# Patient Record
Sex: Male | Born: 1945
Health system: Southern US, Community
[De-identification: ages and names within clinical notes are randomized; demographics above are authoritative.]

## PROBLEM LIST (undated history)

## (undated) DIAGNOSIS — B059 Measles without complication: Secondary | ICD-10-CM

## (undated) DIAGNOSIS — Z9289 Personal history of other medical treatment: Secondary | ICD-10-CM

## (undated) DIAGNOSIS — I10 Essential (primary) hypertension: Secondary | ICD-10-CM

## (undated) DIAGNOSIS — K5792 Diverticulitis of intestine, part unspecified, without perforation or abscess without bleeding: Secondary | ICD-10-CM

## (undated) DIAGNOSIS — B269 Mumps without complication: Secondary | ICD-10-CM

## (undated) DIAGNOSIS — B019 Varicella without complication: Secondary | ICD-10-CM

## (undated) DIAGNOSIS — K635 Polyp of colon: Secondary | ICD-10-CM

## (undated) DIAGNOSIS — M069 Rheumatoid arthritis, unspecified: Secondary | ICD-10-CM

## (undated) DIAGNOSIS — M1990 Unspecified osteoarthritis, unspecified site: Secondary | ICD-10-CM

## (undated) DIAGNOSIS — B1920 Unspecified viral hepatitis C without hepatic coma: Secondary | ICD-10-CM

## (undated) HISTORY — DX: Diverticulitis of intestine, part unspecified, without perforation or abscess without bleeding: K57.92

## (undated) HISTORY — PX: TONSILLECTOMY AND ADENOIDECTOMY: SUR1326

## (undated) HISTORY — DX: Polyp of colon: K63.5

## (undated) HISTORY — DX: Unspecified viral hepatitis C without hepatic coma: B19.20

## (undated) HISTORY — PX: APPENDECTOMY: SHX54

## (undated) HISTORY — DX: Personal history of other medical treatment: Z92.89

## (undated) HISTORY — DX: Rheumatoid arthritis, unspecified: M06.9

## (undated) HISTORY — PX: CHOLECYSTECTOMY: SHX55

## (undated) HISTORY — DX: Mumps without complication: B26.9

## (undated) HISTORY — DX: Essential (primary) hypertension: I10

## (undated) HISTORY — DX: Unspecified osteoarthritis, unspecified site: M19.90

## (undated) HISTORY — PX: REPLACEMENT TOTAL KNEE: SUR1224

## (undated) HISTORY — DX: Varicella without complication: B01.9

## (undated) HISTORY — DX: Measles without complication: B05.9

---

## 2015-12-20 LAB — HM COLONOSCOPY

## 2017-02-10 LAB — CBC AND DIFFERENTIAL
HEMATOCRIT: 45 (ref 41–53)
HEMOGLOBIN: 15.4 (ref 13.5–17.5)
NEUTROS ABS: 3
PLATELETS: 178 (ref 150–399)
WBC: 5.5

## 2017-02-10 LAB — BASIC METABOLIC PANEL
BUN: 11 (ref 4–21)
Creatinine: 0.7 (ref 0.6–1.3)
Glucose: 124
Potassium: 3.7 (ref 3.4–5.3)
Sodium: 138 (ref 137–147)

## 2017-02-10 LAB — HEPATIC FUNCTION PANEL
ALT: 12 (ref 10–40)
AST: 21 (ref 14–40)
Alkaline Phosphatase: 77 (ref 25–125)
BILIRUBIN, TOTAL: 0.3

## 2017-02-13 LAB — HM DEXA SCAN

## 2017-06-23 ENCOUNTER — Ambulatory Visit: Payer: Self-pay | Admitting: Family Medicine

## 2017-06-26 ENCOUNTER — Telehealth: Payer: Self-pay | Admitting: Family Medicine

## 2017-06-26 NOTE — Telephone Encounter (Signed)
We have not seen this pt nor have we prescribed any medications. Not sure what they are referring to. ATC [employer] at number given but it ends up disconnecting. ATC x2. Closing message.

## 2017-06-26 NOTE — Telephone Encounter (Signed)
Copied from [place]. Topic: Quick Communication - Rx Refill/Question >> Jun 26, 2017  1:17 PM Tye [place] wrote: Tommi Rumps from [facility] called b/c she wanted to check into the 4 medications that were faxed over on 12.6.18, they may not be informed that pt has not been seen yet, contact [employer] back thru the fax that was given

## 2017-06-27 ENCOUNTER — Encounter: Payer: Self-pay | Admitting: Adult Health

## 2017-06-27 ENCOUNTER — Ambulatory Visit (INDEPENDENT_AMBULATORY_CARE_PROVIDER_SITE_OTHER): Payer: Medicare HMO | Admitting: Adult Health

## 2017-06-27 VITALS — BP 158/82 | Temp 97.4°F | Ht 67.0 in | Wt 173.0 lb

## 2017-06-27 DIAGNOSIS — R600 Localized edema: Secondary | ICD-10-CM | POA: Insufficient documentation

## 2017-06-27 DIAGNOSIS — I10 Essential (primary) hypertension: Secondary | ICD-10-CM

## 2017-06-27 DIAGNOSIS — F1190 Opioid use, unspecified, uncomplicated: Secondary | ICD-10-CM

## 2017-06-27 DIAGNOSIS — G4700 Insomnia, unspecified: Secondary | ICD-10-CM | POA: Insufficient documentation

## 2017-06-27 DIAGNOSIS — Z7689 Persons encountering health services in other specified circumstances: Secondary | ICD-10-CM | POA: Diagnosis not present

## 2017-06-27 MED ORDER — LOSARTAN POTASSIUM-HCTZ 50-12.5 MG PO TABS: 1.0000 | ORAL_TABLET | Freq: Every day | ORAL | 1 refills | Status: DC

## 2017-06-27 NOTE — Progress Notes (Signed)
Patient presents to clinic today to establish care. He is a pleasant 71 year old male who  has a past medical history of Arthritis, Chicken pox, Colon polyps, Diverticulitis, Hepatitis C, History of blood transfusion, Hypertension, Measles, and Mumps.   He recently moved from [place]   His last physical was in November 2018    Acute Concerns: Establish Care   Chronic Issues: Hep C - Finished treatment 4 years ago.   Hypertension - Takes Hyzaar 50-12.5 mg. He did not take this today as he cannot find his prescription after moving.   Arthritis - Morphine 60 mg BID and Ibuprofen   Insomnia  - Takes Remeron 15 mg   Tobacco Use - Smokes half a pack a day. He has tried Chantix in the past but had to stop taking it due to night terrors   Health Maintenance: Dental -- Has dentures  Vision -- Does routine care  Immunizations -- UTD  Colonoscopy -- 2016 - five year plan  Diet: Eats healthy  Exercise: he goes to the gym daily      Past Medical History:  Diagnosis Date  . Arthritis    Lower spine, left knee and hands  . Chicken pox   . Colon polyps   . Diverticulitis   . Hepatitis C   . History of blood transfusion   . Hypertension   . Measles    Between 51-63 years of age  . Mumps    71 years of age    Past Surgical History:  Procedure Laterality Date  . APPENDECTOMY    . CHOLECYSTECTOMY    . REPLACEMENT TOTAL KNEE     Rt leg  . TONSILLECTOMY AND ADENOIDECTOMY      Current Outpatient Medications on File Prior to Visit  Medication Sig Dispense Refill  . Ascorbic Acid (VITAMIN C PO) Take by mouth.    . Cyanocobalamin (B-12 PO) Take by mouth.    . gabapentin (NEURONTIN) 400 MG capsule Take 400 mg by mouth 4 (four) times daily.    Marland Kitchen ibuprofen (ADVIL,MOTRIN) 600 MG tablet Take 1 tablet by mouth as needed.    Marland Kitchen losartan (COZAAR) 100 MG tablet Take 100 mg by mouth daily.    . mirtazapine (REMERON) 15 MG tablet Take 15 mg by mouth at bedtime.    Marland Kitchen morphine  (MS CONTIN) 60 MG 12 hr tablet Take 60 mg by mouth every 12 (twelve) hours.    . Multiple Vitamin (MULTIVITAMIN) tablet Take 1 tablet by mouth daily.     No current facility-administered medications on file prior to visit.     Allergies  Allergen Reactions  . Codeine     UPSET STOMACH    Family History  Problem Relation Age of Onset  . Emphysema Mother     Social History   Socioeconomic History  . Marital status: Single    Spouse name: Not on file  . Number of children: Not on file  . Years of education: Not on file  . Highest education level: Not on file  Social Needs  . Financial resource strain: Not on file  . Food insecurity - worry: Not on file  . Food insecurity - inability: Not on file  . Transportation needs - medical: Not on file  . Transportation needs - non-medical: Not on file  Occupational History  . Not on file  Tobacco Use  . Smoking status: Current Every Day Smoker  . Smokeless tobacco: Never Used  Substance and Sexual Activity  . Alcohol use: Yes    Alcohol/week: 3.0 oz    Types: 4 Cans of beer, 1 Shots of liquor per week  . Drug use: No  . Sexual activity: Not on file  Other Topics Concern  . Not on file  Social History Narrative  . Not on file    Review of Systems  Constitutional: Negative.   Respiratory: Negative.   Cardiovascular: Negative.   Genitourinary: Negative.   Musculoskeletal: Positive for back pain and joint pain.  Neurological: Negative.   Psychiatric/Behavioral: Negative for depression and suicidal ideas. The patient has insomnia.   All other systems reviewed and are negative.   BP (!) 158/82 (BP Location: Left Arm)   Temp (!) 97.4 F (36.3 C) (Oral)   Ht 5\' 7"  (1.702 m)   Wt 173 lb (78.5 kg)   BMI 27.10 kg/m   Physical Exam  Constitutional: He is oriented to person, place, and time and well-developed, well-nourished, and in no distress. No distress.  Cardiovascular: Normal rate, regular rhythm, normal heart sounds  and intact distal pulses. Exam reveals no gallop and no friction rub.  No murmur heard. Pulmonary/Chest: Effort normal and breath sounds normal. No respiratory distress. He has no wheezes. He has no rales. He exhibits no tenderness.  Musculoskeletal:  Walks with a single prong cane  Neurological: He is alert and oriented to person, place, and time. Gait normal. GCS score is 15.  Skin: Skin is warm and dry. No rash noted. He is not diaphoretic. No erythema. No pallor.  Psychiatric: Mood, memory, affect and judgment normal.  Nursing note and vitals reviewed.  Assessment/Plan: 1. Encounter to establish care - Follow up in November for CPE  - He brought his old records with him. Will review these records and add to chart  - Encouraged to continue exercise and eat healthy  - Needs to quit smoking   2. Narcotic drug use - Pain Mgmt, Profile 8 w/Conf, U - Ambulatory referral to Pain Clinic  3. Essential hypertension -  - losartan-hydrochlorothiazide (HYZAAR) 50-12.5 MG tablet; Take 1 tablet by mouth daily.  Dispense: 90 tablet; Refill: 1  4. Insomnia, unspecified type - Continue Remeron   [name], NP

## 2017-06-27 NOTE — Patient Instructions (Signed)
It was great meeting you today   I will follow up with you regarding your labs and will send in appropriate medications.   Someone from pain management will call you to schedule your appointment   Follow up with me in November for your physical exam    Health Maintenance, Male A healthy lifestyle and preventative care can promote health and wellness.  Maintain regular health, dental, and eye exams.  Eat a healthy diet. Foods like vegetables, fruits, whole grains, low-fat dairy products, and lean protein foods contain the nutrients you need and are low in calories. Decrease your intake of foods high in solid fats, added sugars, and salt. Get information about a proper diet from your health care provider, if necessary.  Regular physical exercise is one of the most important things you can do for your health. Most adults should get at least 150 minutes of moderate-intensity exercise (any activity that increases your heart rate and causes you to sweat) each week. In addition, most adults need muscle-strengthening exercises on 2 or more days a week.   Maintain a healthy weight. The body mass index (BMI) is a screening tool to identify possible weight problems. It provides an estimate of body fat based on height and weight. Your health care provider can find your BMI and can help you achieve or maintain a healthy weight. For males 20 years and older:  A BMI below 18.5 is considered underweight.  A BMI of 18.5 to 24.9 is normal.  A BMI of 25 to 29.9 is considered overweight.  A BMI of 30 and above is considered obese.  Maintain normal blood lipids and cholesterol by exercising and minimizing your intake of saturated fat. Eat a balanced diet with plenty of fruits and vegetables. Blood tests for lipids and cholesterol should begin at age 68 and be repeated every 5 years. If your lipid or cholesterol levels are high, you are over age 47, or you are at high risk for heart disease, you may need your  cholesterol levels checked more frequently.Ongoing high lipid and cholesterol levels should be treated with medicines if diet and exercise are not working.  If you smoke, find out from your health care provider how to quit. If you do not use tobacco, do not start.  Lung cancer screening is recommended for adults aged 17-80 years who are at high risk for developing lung cancer because of a history of smoking. A yearly low-dose CT scan of the lungs is recommended for people who have at least a 30-pack-year history of smoking and are current smokers or have quit within the past 15 years. A pack year of smoking is smoking an average of 1 pack of cigarettes a day for 1 year (for example, a 30-pack-year history of smoking could mean smoking 1 pack a day for 30 years or 2 packs a day for 15 years). Yearly screening should continue until the smoker has stopped smoking for at least 15 years. Yearly screening should be stopped for people who develop a health problem that would prevent them from having lung cancer treatment.  If you choose to drink alcohol, do not have more than 2 drinks per day. One drink is considered to be 12 oz (360 mL) of beer, 5 oz (150 mL) of wine, or 1.5 oz (45 mL) of liquor.  Avoid the use of street drugs. Do not share needles with anyone. Ask for help if you need support or instructions about stopping the use of drugs.  High blood pressure causes heart disease and increases the risk of stroke. High blood pressure is more likely to develop in:  People who have blood pressure in the end of the normal range (100-139/85-89 mm Hg).  People who are overweight or obese.  People who are African American.  If you are 67-40 years of age, have your blood pressure checked every 3-5 years. If you are 67 years of age or older, have your blood pressure checked every year. You should have your blood pressure measured twice--once when you are at a hospital or clinic, and once when you are not at a  hospital or clinic. Record the average of the two measurements. To check your blood pressure when you are not at a hospital or clinic, you can use:  An automated blood pressure machine at a pharmacy.  A home blood pressure monitor.  If you are 69-52 years old, ask your health care provider if you should take aspirin to prevent heart disease.  Diabetes screening involves taking a blood sample to check your fasting blood sugar level. This should be done once every 3 years after age 73 if you are at a normal weight and without risk factors for diabetes. Testing should be considered at a younger age or be carried out more frequently if you are overweight and have at least 1 risk factor for diabetes.  Colorectal cancer can be detected and often prevented. Most routine colorectal cancer screening begins at the age of 36 and continues through age 30. However, your health care provider may recommend screening at an earlier age if you have risk factors for colon cancer. On a yearly basis, your health care provider may provide home test kits to check for hidden blood in the stool. A small camera at the end of a tube may be used to directly examine the colon (sigmoidoscopy or colonoscopy) to detect the earliest forms of colorectal cancer. Talk to your health care provider about this at age 4 when routine screening begins. A direct exam of the colon should be repeated every 5-10 years through age 28, unless early forms of precancerous polyps or small growths are found.  People who are at an increased risk for hepatitis B should be screened for this virus. You are considered at high risk for hepatitis B if:  You were born in a country where hepatitis B occurs often. Talk with your health care provider about which countries are considered high risk.  Your parents were born in a high-risk country and you have not received a shot to protect against hepatitis B (hepatitis B vaccine).  You have HIV or AIDS.  You  use needles to inject street drugs.  You live with, or have sex with, someone who has hepatitis B.  You are a man who has sex with other men (MSM).  You get hemodialysis treatment.  You take certain medicines for conditions like cancer, organ transplantation, and autoimmune conditions.  Hepatitis C blood testing is recommended for all people born from [birth year] through 1965 and any individual with known risk factors for hepatitis C.  Healthy men should no longer receive prostate-specific antigen (PSA) blood tests as part of routine cancer screening. Talk to your health care provider about prostate cancer screening.  Testicular cancer screening is not recommended for adolescents or adult males who have no symptoms. Screening includes self-exam, a health care provider exam, and other screening tests. Consult with your health care provider about any symptoms you have or any  concerns you have about testicular cancer.  Practice safe sex. Use condoms and avoid high-risk sexual practices to reduce the spread of sexually transmitted infections (STIs).  You should be screened for STIs, including gonorrhea and chlamydia if:  You are sexually active and are younger than 24 years.  You are older than 24 years, and your health care provider tells you that you are at risk for this type of infection.  Your sexual activity has changed since you were last screened, and you are at an increased risk for chlamydia or gonorrhea. Ask your health care provider if you are at risk.  If you are at risk of being infected with HIV, it is recommended that you take a prescription medicine daily to prevent HIV infection. This is called pre-exposure prophylaxis (PrEP). You are considered at risk if:  You are a man who has sex with other men (MSM).  You are a heterosexual man who is sexually active with multiple partners.  You take drugs by injection.  You are sexually active with a partner who has HIV.  Talk with  your health care provider about whether you are at high risk of being infected with HIV. If you choose to begin PrEP, you should first be tested for HIV. You should then be tested every 3 months for as long as you are taking PrEP.  Use sunscreen. Apply sunscreen liberally and repeatedly throughout the day. You should seek shade when your shadow is shorter than you. Protect yourself by wearing long sleeves, pants, a wide-brimmed hat, and sunglasses year round whenever you are outdoors.  Tell your health care provider of new moles or changes in moles, especially if there is a change in shape or color. Also, tell your health care provider if a mole is larger than the size of a pencil eraser.  A one-time screening for abdominal aortic aneurysm (AAA) and surgical repair of large AAAs by ultrasound is recommended for men aged 51-75 years who are current or former smokers.  Stay current with your vaccines (immunizations).   This information is not intended to replace advice given to you by your health care provider. Make sure you discuss any questions you have with your health care provider.   Document Released: 12/28/2007 Document Revised: 07/22/2014 Document Reviewed: 11/26/2010 Elsevier Interactive Patient Education [employer].

## 2017-06-30 ENCOUNTER — Telehealth: Payer: Self-pay | Admitting: *Deleted

## 2017-06-30 NOTE — Telephone Encounter (Signed)
Request sent to Dr for review

## 2017-06-30 NOTE — Telephone Encounter (Signed)
Patient came into office needing refill on MS Contin 60 mg  12 HR tablet. Patient stated that pharmacy said that he needed a paper script.

## 2017-06-30 NOTE — Telephone Encounter (Signed)
Pt need new Rx for morphine and state that he will be out today and would like to see if he can wait or come back to get the Rx?

## 2017-07-01 ENCOUNTER — Encounter: Payer: Self-pay | Admitting: Adult Health

## 2017-07-01 NOTE — Telephone Encounter (Signed)
Called and spoke to the pt.  Advised that we are still waiting on the urine drug screen results.  Pt is completely out of medication.  I advised OTC meds to help alleviate the pain.  Pt also said he will call previous PCP to see if he can get a prescription to hold him over until [facility] can fill.  Advised pt that I will call back as soon as I get results from screen.  Unfortunately, I do not have a time frame.

## 2017-07-01 NOTE — Telephone Encounter (Signed)
Pt is requesting the morphine and would like to see if he could get it today due to him being out of the medication today (pt is in the office now).

## 2017-07-01 NOTE — Telephone Encounter (Signed)
I am still waiting for his urine drug screen to come back from the lab. I am hoping it comes back today. Legally, I can not write for any narcotics until I get that back. I am sorry

## 2017-07-02 ENCOUNTER — Other Ambulatory Visit: Payer: Self-pay | Admitting: Adult Health

## 2017-07-02 ENCOUNTER — Other Ambulatory Visit: Payer: Self-pay | Admitting: Family Medicine

## 2017-07-02 LAB — PAIN MGMT, PROFILE 8 W/CONF, U
6 Acetylmorphine: NEGATIVE ng/mL (ref <10)
ALCOHOL METABOLITES: POSITIVE ng/mL — AB (ref <500)
AMPHETAMINES: NEGATIVE ng/mL (ref <500)
BENZODIAZEPINES: NEGATIVE ng/mL (ref <100)
BUPRENORPHINE, URINE: NEGATIVE ng/mL (ref <5)
Buprenorphine: NEGATIVE ng/mL (ref <2)
COCAINE METABOLITE: NEGATIVE ng/mL (ref <150)
Codeine: NEGATIVE ng/mL (ref <50)
Creatinine: 167.7 mg/dL
ETHYL SULFATE (ETS): 5468 ng/mL — AB (ref <100)
Ethyl Glucuronide (ETG): 11829 ng/mL — ABNORMAL HIGH (ref <500)
HYDROCODONE: NEGATIVE ng/mL (ref <50)
Hydromorphone: 640 ng/mL — ABNORMAL HIGH (ref <50)
MARIJUANA METABOLITE: NEGATIVE ng/mL (ref <20)
MDMA: NEGATIVE ng/mL (ref <500)
NORBUPRENORPHINE: NEGATIVE ng/mL (ref <2)
NORHYDROCODONE: NEGATIVE ng/mL (ref <50)
OPIATES: POSITIVE ng/mL — AB (ref <100)
OXIDANT: NEGATIVE ug/mL (ref <200)
Oxycodone: NEGATIVE ng/mL (ref <100)
PH: 6.45 (ref 4.5–9.0)

## 2017-07-02 MED ORDER — MORPHINE SULFATE ER 60 MG PO TBCR: 60.0000 mg | EXTENDED_RELEASE_TABLET | Freq: Two times a day (BID) | ORAL | 0 refills | Status: DC

## 2017-07-02 MED ORDER — MORPHINE SULFATE ER 60 MG PO TBCR: 60.0000 mg | EXTENDED_RELEASE_TABLET | Freq: Two times a day (BID) | ORAL | 0 refills | Status: AC

## 2017-07-02 NOTE — Progress Notes (Signed)
Opened in error

## 2017-07-03 ENCOUNTER — Encounter: Payer: Self-pay | Admitting: Family Medicine

## 2017-07-03 MED ORDER — IBUPROFEN 600 MG PO TABS: 600.0000 mg | ORAL_TABLET | ORAL | 1 refills | Status: DC | PRN

## 2017-07-03 MED ORDER — GABAPENTIN 400 MG PO CAPS: 400.0000 mg | ORAL_CAPSULE | Freq: Four times a day (QID) | ORAL | 1 refills | Status: DC

## 2017-07-03 NOTE — Telephone Encounter (Signed)
Ok to refill 90 +1  

## 2017-07-03 NOTE — Telephone Encounter (Signed)
Sent to the pharmacy by e-scribe. 

## 2017-07-10 ENCOUNTER — Telehealth: Payer: Self-pay | Admitting: Adult Health

## 2017-07-10 DIAGNOSIS — I10 Essential (primary) hypertension: Secondary | ICD-10-CM

## 2017-07-10 MED ORDER — MIRTAZAPINE 15 MG PO TABS: 15.0000 mg | ORAL_TABLET | Freq: Every day | ORAL | 1 refills | Status: DC

## 2017-07-10 MED ORDER — LOSARTAN POTASSIUM-HCTZ 50-12.5 MG PO TABS: 1.0000 | ORAL_TABLET | Freq: Every day | ORAL | 1 refills | Status: DC

## 2017-07-10 NOTE — Telephone Encounter (Signed)
Sent to the pharmacy by e-scribe. 

## 2017-07-10 NOTE — Telephone Encounter (Signed)
Pt is in the office stating that his Rx losartan-hyrochlorothiazide, gabapentin, ibuprofen and mirtazapine needs to be faxed to mail order [facility] he is not currently out of these he just wanted to update the pharmacy.

## 2017-07-10 NOTE — Telephone Encounter (Signed)
Ok to send in Remeron and Hyzaar for 6 months

## 2017-07-10 NOTE — Telephone Encounter (Signed)
Zachary Cordova has authorized gabapentin and ibuprofen.  Need authorization to fill losartan-hctz and mirtazapine to 90 day supply company.

## 2017-07-17 ENCOUNTER — Other Ambulatory Visit: Payer: Self-pay | Admitting: Family Medicine

## 2017-07-17 MED ORDER — IBUPROFEN 600 MG PO TABS: 600.0000 mg | ORAL_TABLET | Freq: Three times a day (TID) | ORAL | 1 refills | Status: DC | PRN

## 2017-07-17 NOTE — Telephone Encounter (Signed)
Copied from [place] [id number]. Topic: Quick Communication - Rx Refill/Question >> Jul 17, 2017  2:36 PM Carolyn Stare wrote: Has the patient contacted their pharmacy yes    Showing the med was sent in but [facility] said they did not received the rx   ibuprofen (ADVIL,MOTRIN) 600 MG tablet  [employer]   Preferred Pharmacy (with phone number or street name): ***   Agent: Please be advised that RX refills may take up to 3 business days. We ask that you follow-up with your pharmacy.

## 2017-07-17 NOTE — Telephone Encounter (Signed)
Zachary Cordova saw this patient on 06/27/17 / ibuprofen (ADVIL,MOTRIN) 600 MG tablet 90 tablet 1 07/03/2017    Sig - Route: Take 1 tablet (600 mg total) by mouth as needed. - Oral   Sent to pharmacy as: ibuprofen (ADVIL,MOTRIN) 600 MG tablet   E-Prescribing Status: Receipt confirmed by pharmacy (07/03/2017 10:39 AM EST)   Pharmacy   [place], [address]   This prescription was e-prescribed to [facility] on 07/03/17.  Please see latest note on 07/17/17  below from [facility] saying they did not receive prescription.  Please reorder if needed.

## 2017-07-17 NOTE — Telephone Encounter (Signed)
Copied from [place] [phone]. Topic: Quick Communication - Rx Refill/Question >> Jul 17, 2017  2:32 PM Carolyn Stare wrote: [facility] said they received all of pt rx's except the one below  showing rx was filled on 06/2017 but pharmacy does not have the rx    ibuprofen (ADVIL,MOTRIN) 600 MG tablet  [place]    Preferred Pharmacy (with phone number or street name): ***   Agent: Please be advised that RX refills may take up to 3 business days. We ask that you follow-up with your pharmacy.

## 2017-07-17 NOTE — Telephone Encounter (Signed)
Prescription called to the pharmacist.

## 2017-07-21 DIAGNOSIS — M545 Low back pain: Secondary | ICD-10-CM | POA: Diagnosis not present

## 2017-07-21 DIAGNOSIS — Z79899 Other long term (current) drug therapy: Secondary | ICD-10-CM | POA: Diagnosis not present

## 2017-07-21 DIAGNOSIS — G8929 Other chronic pain: Secondary | ICD-10-CM | POA: Diagnosis not present

## 2017-07-30 DIAGNOSIS — G8929 Other chronic pain: Secondary | ICD-10-CM | POA: Diagnosis not present

## 2017-07-30 DIAGNOSIS — M545 Low back pain: Secondary | ICD-10-CM | POA: Diagnosis not present

## 2017-07-30 DIAGNOSIS — Z79899 Other long term (current) drug therapy: Secondary | ICD-10-CM | POA: Diagnosis not present

## 2017-08-19 ENCOUNTER — Ambulatory Visit: Payer: Self-pay | Admitting: *Deleted

## 2017-08-19 NOTE — Telephone Encounter (Signed)
   Reason for Disposition . [2] Systolic BP  >= 244 OR Diastolic >= 90 AND [9] taking BP medications  Answer Assessment - Initial Assessment Questions 1. BLOOD PRESSURE: "What is the blood pressure?" "Did you take at least two measurements 5 minutes apart?"    155/100   And  5  mins  Later   166/95    2. ONSET: "When did you take your blood pressure?"      1340  Today   3. HOW: "How did you obtain the blood pressure?" (e.g., visiting nurse, automatic home BP monitor)     Automatic   bp  Machine   4. HISTORY: "Do you have a history of high blood pressure?"       yes 5. MEDICATIONS: "Are you taking any medications for blood pressure?" "Have you missed any doses recently?"      Yes    No  Missed  meds    6. OTHER SYMPTOMS: "Do you have any symptoms?" (e.g., headache, chest pain, blurred vision, difficulty breathing, weakness)        [place] -  Weaning  Off  Morphine   Provider  Directed    7. PREGNANCY: "Is there any chance you are pregnant?" "When was your last menstrual period?"     n/a  Protocols used: HIGH BLOOD PRESSURE-A-AH

## 2017-08-19 NOTE — Telephone Encounter (Signed)
Pt   Advised    To    Take  Medication  As  prescribed  Until office  Visit   tommorow  With  Sallee Provencal     Told  To   Call  911  Or proceed  To  Er  If  Any  Numbness    Slurred  Speech  Or  Any worsening  Of  Symptoms    appt is  For tommorow   At 1100  Am

## 2017-08-20 ENCOUNTER — Encounter: Payer: Self-pay | Admitting: Adult Health

## 2017-08-20 ENCOUNTER — Ambulatory Visit (INDEPENDENT_AMBULATORY_CARE_PROVIDER_SITE_OTHER): Payer: Medicare HMO | Admitting: Adult Health

## 2017-08-20 VITALS — BP 138/80 | Temp 98.2°F | Wt 178.0 lb

## 2017-08-20 DIAGNOSIS — I10 Essential (primary) hypertension: Secondary | ICD-10-CM | POA: Diagnosis not present

## 2017-08-20 NOTE — Progress Notes (Signed)
Subjective:    Patient ID: Zachary Cordova, male    DOB: Aug 14, 1945, 72 y.o.   MRN: [id number]  HPI  72 year old male who  has a past medical history of Arthritis, Chicken pox, Colon polyps, Diverticulitis, Hepatitis C, History of blood transfusion, Hypertension, Measles, Mumps, and Rheumatoid arthritis ([place]).   He presents to the clinic today for the acute concern of elevated blood pressure readings at home. He reports that over the last week his blood pressure has been fluctuating. Two days ago he had a blood pressure reading of 208/108, yesterday it was 180/86, and this morning his blood pressure at home was 168/86. He denies headaches or blurred vision but felt a " little twinge" in his ears when his blood pressure becomes elevated     Review of Systems   See HPI  Past Medical History:  Diagnosis Date  . Arthritis    Lower spine, left knee and hands  . Chicken pox   . Colon polyps   . Diverticulitis   . Hepatitis C   . History of blood transfusion   . Hypertension   . Measles    Between 61-67 years of age  . Mumps    72 years of age  . Rheumatoid arthritis ([place])     Social History   Socioeconomic History  . Marital status: Single    Spouse name: Not on file  . Number of children: Not on file  . Years of education: Not on file  . Highest education level: Not on file  Social Needs  . Financial resource strain: Not on file  . Food insecurity - worry: Not on file  . Food insecurity - inability: Not on file  . Transportation needs - medical: Not on file  . Transportation needs - non-medical: Not on file  Occupational History  . Not on file  Tobacco Use  . Smoking status: Current Every Day Smoker  . Smokeless tobacco: Never Used  Substance and Sexual Activity  . Alcohol use: Yes    Alcohol/week: 3.0 oz    Types: 4 Cans of beer, 1 Shots of liquor per week  . Drug use: No  . Sexual activity: Not on file  Other Topics Concern  . Not on file  Social  History Narrative  . Not on file    Past Surgical History:  Procedure Laterality Date  . APPENDECTOMY    . CHOLECYSTECTOMY    . REPLACEMENT TOTAL KNEE     Rt leg  . TONSILLECTOMY AND ADENOIDECTOMY      Family History  Problem Relation Age of Onset  . Emphysema Mother     Allergies  Allergen Reactions  . Codeine     UPSET STOMACH    Current Outpatient Medications on File Prior to Visit  Medication Sig Dispense Refill  . Ascorbic Acid (VITAMIN C PO) Take by mouth.    . Cyanocobalamin (B-12 PO) Take by mouth.    . gabapentin (NEURONTIN) 400 MG capsule Take 1 capsule (400 mg total) by mouth 4 (four) times daily. 360 capsule 1  . ibuprofen (ADVIL,MOTRIN) 600 MG tablet Take 1 tablet (600 mg total) by mouth every 8 (eight) hours as needed. 90 tablet 1  . losartan-hydrochlorothiazide (HYZAAR) 50-12.5 MG tablet Take 1 tablet by mouth daily. 90 tablet 1  . mirtazapine (REMERON) 15 MG tablet Take 1 tablet (15 mg total) by mouth at bedtime. 90 tablet 1  . morphine (MS CONTIN) 60 MG 12 hr tablet  Take 1 tablet (60 mg total) by mouth every 12 (twelve) hours. 60 tablet 0  . morphine (MS CONTIN) 60 MG 12 hr tablet Take 1 tablet (60 mg total) by mouth every 12 (twelve) hours. 60 tablet 0  . Multiple Vitamin (MULTIVITAMIN) tablet Take 1 tablet by mouth daily.     No current facility-administered medications on file prior to visit.     BP 138/80 (BP Location: Left Arm)   Temp 98.2 F (36.8 C) (Oral)   Wt 178 lb (80.7 kg)   BMI 27.88 kg/m       Objective:   Physical Exam  Constitutional: He is oriented to person, place, and time. He appears well-developed and well-nourished. No distress.  Cardiovascular: Normal rate, regular rhythm, normal heart sounds and intact distal pulses. Exam reveals no gallop and no friction rub.  No murmur heard. Pulmonary/Chest: Effort normal and breath sounds normal. No respiratory distress. He has no wheezes. He has no rales. He exhibits no tenderness.    Neurological: He is alert and oriented to person, place, and time.  Skin: Skin is warm and dry. No rash noted. No erythema. No pallor.  Psychiatric: He has a normal mood and affect. His behavior is normal. Judgment and thought content normal.  Nursing note and vitals reviewed.     Assessment & Plan:  1. Essential hypertension - BP near goal today  - Will have him monitor BP twice a day for two weeks  - Bring log to next appointment with BP cuff  - Follow up sooner if needed  [name], NP

## 2017-08-29 DIAGNOSIS — Z79899 Other long term (current) drug therapy: Secondary | ICD-10-CM | POA: Diagnosis not present

## 2017-08-29 DIAGNOSIS — M1990 Unspecified osteoarthritis, unspecified site: Secondary | ICD-10-CM | POA: Diagnosis not present

## 2017-09-03 ENCOUNTER — Ambulatory Visit: Payer: Medicare HMO | Admitting: Adult Health

## 2017-09-05 ENCOUNTER — Other Ambulatory Visit: Payer: Self-pay | Admitting: Adult Health

## 2017-09-05 NOTE — Telephone Encounter (Signed)
Sent to the pharmacy by e-scribe. 

## 2017-09-05 NOTE — Telephone Encounter (Signed)
Ok to refill for 90 days  

## 2017-09-10 ENCOUNTER — Ambulatory Visit (INDEPENDENT_AMBULATORY_CARE_PROVIDER_SITE_OTHER): Payer: Medicare HMO | Admitting: Adult Health

## 2017-09-10 ENCOUNTER — Encounter: Payer: Self-pay | Admitting: Adult Health

## 2017-09-10 VITALS — BP 148/80 | Temp 97.7°F | Wt 175.0 lb

## 2017-09-10 DIAGNOSIS — B359 Dermatophytosis, unspecified: Secondary | ICD-10-CM | POA: Diagnosis not present

## 2017-09-10 DIAGNOSIS — I10 Essential (primary) hypertension: Secondary | ICD-10-CM | POA: Diagnosis not present

## 2017-09-10 MED ORDER — TRIAMCINOLONE ACETONIDE 0.5 % EX OINT: 1.0000 "application " | TOPICAL_OINTMENT | Freq: Two times a day (BID) | CUTANEOUS | 0 refills | Status: DC

## 2017-09-10 MED ORDER — LOSARTAN POTASSIUM 25 MG PO TABS: 25.0000 mg | ORAL_TABLET | Freq: Every day | ORAL | 1 refills | Status: DC

## 2017-09-10 MED ORDER — TERBINAFINE HCL 250 MG PO TABS: 250.0000 mg | ORAL_TABLET | Freq: Every day | ORAL | 0 refills | Status: DC

## 2017-09-10 NOTE — Progress Notes (Signed)
Subjective:    Patient ID: Zachary Cordova, male    DOB: July 22, 1945, 72 y.o.   MRN: [id number]  HPI  72 year old male who presents to the office today for two-week follow-up regarding hypertension.  When I last saw him he reported fluctuating blood pressure readings at home.  His readings were 208/108, 180/86, and 168/96.  In the office blood pressure was relatively normal at 138/80.  He is currently maintained on Hyzaar 50-12.5 mg.  He was asked to bring his blood pressure cuff and blood pressure log to the next appointment  Today in the office he reports that his blood pressures at home have been in the 170-150's/90-100's at home - per his log. He denies any headaches or blurred   He also reports a rash on his arms and legs that he first noticed three weeks ago. He reports red circles on his legs and a red scattered rash on his arms. His rash has been spreading on his legs. Denies any drainage.      Review of Systems  Constitutional: Negative.   Respiratory: Negative.   Cardiovascular: Negative.   All other systems reviewed and are negative.  Past Medical History:  Diagnosis Date  . Arthritis    Lower spine, left knee and hands  . Chicken pox   . Colon polyps   . Diverticulitis   . Hepatitis C   . History of blood transfusion   . Hypertension   . Measles    Between 63-62 years of age  . Mumps    72 years of age  . Rheumatoid arthritis ([place])     Social History   Socioeconomic History  . Marital status: Single    Spouse name: Not on file  . Number of children: Not on file  . Years of education: Not on file  . Highest education level: Not on file  Social Needs  . Financial resource strain: Not on file  . Food insecurity - worry: Not on file  . Food insecurity - inability: Not on file  . Transportation needs - medical: Not on file  . Transportation needs - non-medical: Not on file  Occupational History  . Not on file  Tobacco Use  . Smoking status:  Current Every Day Smoker  . Smokeless tobacco: Never Used  Substance and Sexual Activity  . Alcohol use: Yes    Alcohol/week: 3.0 oz    Types: 4 Cans of beer, 1 Shots of liquor per week  . Drug use: No  . Sexual activity: Not on file  Other Topics Concern  . Not on file  Social History Narrative  . Not on file    Past Surgical History:  Procedure Laterality Date  . APPENDECTOMY    . CHOLECYSTECTOMY    . REPLACEMENT TOTAL KNEE     Rt leg  . TONSILLECTOMY AND ADENOIDECTOMY      Family History  Problem Relation Age of Onset  . Emphysema Mother     Allergies  Allergen Reactions  . Codeine     UPSET STOMACH    Current Outpatient Medications on File Prior to Visit  Medication Sig Dispense Refill  . Ascorbic Acid (VITAMIN C PO) Take by mouth.    . Cyanocobalamin (B-12 PO) Take by mouth.    . gabapentin (NEURONTIN) 400 MG capsule Take 1 capsule (400 mg total) by mouth 4 (four) times daily. 360 capsule 1  . ibuprofen (ADVIL,MOTRIN) 600 MG tablet TAKE 1 TABLET EVERY 8 HOURS AS  NEEDED 90 tablet 0  . losartan-hydrochlorothiazide (HYZAAR) 50-12.5 MG tablet Take 1 tablet by mouth daily. 90 tablet 1  . mirtazapine (REMERON) 15 MG tablet Take 1 tablet (15 mg total) by mouth at bedtime. 90 tablet 1  . morphine (MS CONTIN) 60 MG 12 hr tablet Take 1 tablet (60 mg total) by mouth every 12 (twelve) hours. 60 tablet 0  . morphine (MS CONTIN) 60 MG 12 hr tablet Take 1 tablet (60 mg total) by mouth every 12 (twelve) hours. 60 tablet 0  . Multiple Vitamin (MULTIVITAMIN) tablet Take 1 tablet by mouth daily.     No current facility-administered medications on file prior to visit.     BP (!) 148/80   Temp 97.7 F (36.5 C) (Oral)   Wt 175 lb (79.4 kg)   BMI 27.41 kg/m       Objective:   Physical Exam  Constitutional: He is oriented to person, place, and time. He appears well-developed and well-nourished. No distress.  Cardiovascular: Normal rate, regular rhythm, normal heart sounds  and intact distal pulses. Exam reveals no gallop and no friction rub.  No murmur heard. Pulmonary/Chest: Effort normal and breath sounds normal. No respiratory distress. He has no wheezes. He has no rales. He exhibits no tenderness.  Musculoskeletal: Normal range of motion. He exhibits no edema, tenderness or deformity.  Neurological: He is alert and oriented to person, place, and time.  Skin: Skin is warm and dry. No rash noted. He is not diaphoretic. No erythema. No pallor.  Rash on bilateral legs and arms. Appears as tinea infection. No drainage noted  Psychiatric: He has a normal mood and affect. His behavior is normal. Judgment and thought content normal.  Nursing note and vitals reviewed.     Assessment & Plan:  1. Essential hypertension - will add losartan 25 mg QHS. Continue to monitor BP at home. Please follow up in 2-3 days  - losartan (COZAAR) 25 MG tablet; Take 1 tablet (25 mg total) by mouth at bedtime.  Dispense: 90 tablet; Refill: 1  2. Tinea  - terbinafine (LAMISIL) 250 MG tablet; Take 1 tablet (250 mg total) by mouth daily.  Dispense: 30 tablet; Refill: 0 - triamcinolone ointment (KENALOG) 0.5 %; Apply 1 application topically 2 (two) times daily.  Dispense: 30 g; Refill: 0   [name], NP

## 2017-09-12 DIAGNOSIS — G8929 Other chronic pain: Secondary | ICD-10-CM | POA: Diagnosis not present

## 2017-09-12 DIAGNOSIS — M545 Low back pain: Secondary | ICD-10-CM | POA: Diagnosis not present

## 2017-09-29 ENCOUNTER — Other Ambulatory Visit: Payer: Self-pay | Admitting: Adult Health

## 2017-10-07 ENCOUNTER — Ambulatory Visit (INDEPENDENT_AMBULATORY_CARE_PROVIDER_SITE_OTHER): Payer: Medicare HMO | Admitting: Adult Health

## 2017-10-07 ENCOUNTER — Encounter: Payer: Self-pay | Admitting: Adult Health

## 2017-10-07 VITALS — BP 110/60 | Temp 97.6°F | Wt 182.0 lb

## 2017-10-07 DIAGNOSIS — J0140 Acute pansinusitis, unspecified: Secondary | ICD-10-CM | POA: Diagnosis not present

## 2017-10-07 DIAGNOSIS — L231 Allergic contact dermatitis due to adhesives: Secondary | ICD-10-CM | POA: Diagnosis not present

## 2017-10-07 MED ORDER — METHYLPREDNISOLONE ACETATE 80 MG/ML IJ SUSP
80.0000 mg | Freq: Once | INTRAMUSCULAR | Status: AC
Start: 2017-10-07 — End: 2017-10-07
  Administered 2017-10-07: 80 mg via INTRAMUSCULAR

## 2017-10-07 MED ORDER — DOXYCYCLINE HYCLATE 100 MG PO CAPS: 100.0000 mg | ORAL_CAPSULE | Freq: Two times a day (BID) | ORAL | 0 refills | Status: DC

## 2017-10-07 NOTE — Progress Notes (Signed)
Subjective:    Patient ID: Zachary Cordova, male    DOB: 1946/06/08, 72 y.o.   MRN: [id number]  HPI  72 year old male who   has a past medical history of Arthritis, Chicken pox, Colon polyps, Diverticulitis, Hepatitis C, History of blood transfusion, Hypertension, Measles, Mumps, and Rheumatoid arthritis ([place]).  He presents to the office today for two complaints  1. He continues to have a red rash on his lower extremities and feels as though it is getting worse. I last saw him for this one month ago and at that time though that it appears as a tinea infection and he was prescribed Lamisil 250 mg PO. He reports taking this daily but has not noticed any changes  2. He reports for the last 7-10 days he has experienced sinus pain pressure, productive cough, wheezing, and feeling fatigued. Denies any fevers. He has not been using anything OTC. He feels as though his symptoms are not improving.   Review of Systems See HPI   Past Medical History:  Diagnosis Date  . Arthritis    Lower spine, left knee and hands  . Chicken pox   . Colon polyps   . Diverticulitis   . Hepatitis C   . History of blood transfusion   . Hypertension   . Measles    Between 34-23 years of age  . Mumps    72 years of age  . Rheumatoid arthritis ([place])     Social History   Socioeconomic History  . Marital status: Single    Spouse name: Not on file  . Number of children: Not on file  . Years of education: Not on file  . Highest education level: Not on file  Occupational History  . Not on file  Social Needs  . Financial resource strain: Not on file  . Food insecurity:    Worry: Not on file    Inability: Not on file  . Transportation needs:    Medical: Not on file    Non-medical: Not on file  Tobacco Use  . Smoking status: Current Every Day Smoker  . Smokeless tobacco: Never Used  Substance and Sexual Activity  . Alcohol use: Yes    Alcohol/week: 3.0 oz    Types: 4 Cans of beer, 1 Shots of  liquor per week  . Drug use: No  . Sexual activity: Not on file  Lifestyle  . Physical activity:    Days per week: Not on file    Minutes per session: Not on file  . Stress: Not on file  Relationships  . Social connections:    Talks on phone: Not on file    Gets together: Not on file    Attends religious service: Not on file    Active member of club or organization: Not on file    Attends meetings of clubs or organizations: Not on file    Relationship status: Not on file  . Intimate partner violence:    Fear of current or ex partner: Not on file    Emotionally abused: Not on file    Physically abused: Not on file    Forced sexual activity: Not on file  Other Topics Concern  . Not on file  Social History Narrative  . Not on file    Past Surgical History:  Procedure Laterality Date  . APPENDECTOMY    . CHOLECYSTECTOMY    . REPLACEMENT TOTAL KNEE     Rt leg  .  TONSILLECTOMY AND ADENOIDECTOMY      Family History  Problem Relation Age of Onset  . Emphysema Mother     Allergies  Allergen Reactions  . Codeine     UPSET STOMACH    Current Outpatient Medications on File Prior to Visit  Medication Sig Dispense Refill  . Ascorbic Acid (VITAMIN C PO) Take by mouth.    . Cyanocobalamin (B-12 PO) Take by mouth.    . gabapentin (NEURONTIN) 400 MG capsule Take 1 capsule (400 mg total) by mouth 4 (four) times daily. 360 capsule 1  . ibuprofen (ADVIL,MOTRIN) 600 MG tablet TAKE 1 TABLET EVERY 8 HOURS AS NEEDED 90 tablet 1  . losartan (COZAAR) 25 MG tablet Take 1 tablet (25 mg total) by mouth at bedtime. 90 tablet 1  . losartan-hydrochlorothiazide (HYZAAR) 50-12.5 MG tablet Take 1 tablet by mouth daily. 90 tablet 1  . mirtazapine (REMERON) 15 MG tablet Take 1 tablet (15 mg total) by mouth at bedtime. 90 tablet 1  . MORPHABOND ER 60 MG T12A Take 1 tablet by mouth 2 (two) times daily.  0  . Multiple Vitamin (MULTIVITAMIN) tablet Take 1 tablet by mouth daily.    Marland Kitchen NARCAN 4 MG/0.1ML  LIQD nasal spray kit INHALE 1 SPRAY AS NEEDED FOR ACCIDENTAL OVERDOSE  0  . triamcinolone ointment (KENALOG) 0.5 % Apply 1 application topically 2 (two) times daily. 30 g 0   No current facility-administered medications on file prior to visit.     BP 110/60 (BP Location: Left Arm)   Temp 97.6 F (36.4 C) (Oral)   Wt 182 lb (82.6 kg)   BMI 28.51 kg/m       Objective:   Physical Exam  Constitutional: He is oriented to person, place, and time. He appears well-developed and well-nourished. No distress.  HENT:  Right Ear: Hearing, tympanic membrane, external ear and ear canal normal.  Left Ear: Hearing, tympanic membrane, external ear and ear canal normal.  Nose: Mucosal edema and rhinorrhea present. Right sinus exhibits maxillary sinus tenderness and frontal sinus tenderness. Left sinus exhibits maxillary sinus tenderness and frontal sinus tenderness.  Mouth/Throat: Uvula is midline, oropharynx is clear and moist and mucous membranes are normal.  Cardiovascular: Normal rate, regular rhythm, normal heart sounds and intact distal pulses. Exam reveals no gallop.  No murmur heard. Pulmonary/Chest: Effort normal. No respiratory distress. He has wheezes. He has no rales. He exhibits no tenderness.  Neurological: He is alert and oriented to person, place, and time.  Skin: Skin is warm and dry. No rash noted. He is not diaphoretic. No erythema. No pallor.  Red rash appearing on lower extremities. Worse on right. Rash now appears as contact dermatitis.   Psychiatric: He has a normal mood and affect. His behavior is normal. Judgment and thought content normal.  Nursing note and vitals reviewed.     Assessment & Plan:  1. Acute non-recurrent pansinusitis - Advised to quit smoking  - Stay hydrated and rest  - Can use Mucinex for cough.  - doxycycline (VIBRAMYCIN) 100 MG capsule; Take 1 capsule (100 mg total) by mouth 2 (two) times daily.  Dispense: 14 capsule; Refill: 0 - Follow up as  needed  2. Allergic contact dermatitis due to adhesives - Will treat with prednisone. Advised low cut socks  - methylPREDNISolone acetate (DEPO-MEDROL) injection 80 mg - Consider referral to dermatology if not improved    [name], NP

## 2017-10-07 NOTE — Patient Instructions (Signed)
It was great seeing you today   I have sent in a prescription for Doxycycline to treat your sinus infection   Please follow up if no improvement

## 2017-10-13 DIAGNOSIS — G8929 Other chronic pain: Secondary | ICD-10-CM | POA: Diagnosis not present

## 2017-10-13 DIAGNOSIS — M545 Low back pain: Secondary | ICD-10-CM | POA: Diagnosis not present

## 2017-10-13 DIAGNOSIS — M1990 Unspecified osteoarthritis, unspecified site: Secondary | ICD-10-CM | POA: Diagnosis not present

## 2017-10-13 DIAGNOSIS — Z79899 Other long term (current) drug therapy: Secondary | ICD-10-CM | POA: Diagnosis not present

## 2017-10-22 ENCOUNTER — Ambulatory Visit (INDEPENDENT_AMBULATORY_CARE_PROVIDER_SITE_OTHER): Payer: Medicare HMO | Admitting: Adult Health

## 2017-10-22 ENCOUNTER — Encounter: Payer: Self-pay | Admitting: Adult Health

## 2017-10-22 VITALS — BP 128/76 | Temp 97.7°F | Wt 181.0 lb

## 2017-10-22 DIAGNOSIS — B359 Dermatophytosis, unspecified: Secondary | ICD-10-CM | POA: Diagnosis not present

## 2017-10-22 DIAGNOSIS — R5383 Other fatigue: Secondary | ICD-10-CM | POA: Diagnosis not present

## 2017-10-22 DIAGNOSIS — N529 Male erectile dysfunction, unspecified: Secondary | ICD-10-CM | POA: Diagnosis not present

## 2017-10-22 LAB — HEPATIC FUNCTION PANEL
ALT: 17 U/L (ref 0–53)
AST: 22 U/L (ref 0–37)
Albumin: 4.5 g/dL (ref 3.5–5.2)
Alkaline Phosphatase: 70 U/L (ref 39–117)
Bilirubin, Direct: 0.1 mg/dL (ref 0.0–0.3)
TOTAL PROTEIN: 7.1 g/dL (ref 6.0–8.3)
Total Bilirubin: 0.7 mg/dL (ref 0.2–1.2)

## 2017-10-22 LAB — TSH: TSH: 2.54 u[IU]/mL (ref 0.35–4.50)

## 2017-10-22 MED ORDER — TRIAMCINOLONE ACETONIDE 0.5 % EX OINT
1.0000 | TOPICAL_OINTMENT | Freq: Two times a day (BID) | CUTANEOUS | 2 refills | Status: AC
Start: 2017-10-22 — End: ?

## 2017-10-22 NOTE — Progress Notes (Signed)
Subjective:    Patient ID: Zachary Cordova, male    DOB: 1945/12/03, 71 y.o.   MRN: [id number]  HPI  72 year old male who  has a past medical history of Arthritis, Chicken pox, Colon polyps, Diverticulitis, Hepatitis C, History of blood transfusion, Hypertension, Measles, Mumps, and Rheumatoid arthritis ([place]).    He would also like to have his testosterone level checked due to ED. He reports trouble getting an erection and feels as though he does not have a sex drive. Per patient " I do not wake up that often with an erection." His symptoms have been present for 3-4 years. In addition to ED, he also reports constant fatigue. He is sleeping well at night. Does not snore or feel as though he is waking up in a panic.   Review of Systems See HPI   Past Medical History:  Diagnosis Date  . Arthritis    Lower spine, left knee and hands  . Chicken pox   . Colon polyps   . Diverticulitis   . Hepatitis C   . History of blood transfusion   . Hypertension   . Measles    Between 59-19 years of age  . Mumps    73 years of age  . Rheumatoid arthritis ([place])     Social History   Socioeconomic History  . Marital status: Single    Spouse name: Not on file  . Number of children: Not on file  . Years of education: Not on file  . Highest education level: Not on file  Occupational History  . Not on file  Social Needs  . Financial resource strain: Not on file  . Food insecurity:    Worry: Not on file    Inability: Not on file  . Transportation needs:    Medical: Not on file    Non-medical: Not on file  Tobacco Use  . Smoking status: Current Every Day Smoker  . Smokeless tobacco: Never Used  Substance and Sexual Activity  . Alcohol use: Yes    Alcohol/week: 3.0 oz    Types: 4 Cans of beer, 1 Shots of liquor per week  . Drug use: No  . Sexual activity: Not on file  Lifestyle  . Physical activity:    Days per week: Not on file    Minutes per session: Not on file  . Stress:  Not on file  Relationships  . Social connections:    Talks on phone: Not on file    Gets together: Not on file    Attends religious service: Not on file    Active member of club or organization: Not on file    Attends meetings of clubs or organizations: Not on file    Relationship status: Not on file  . Intimate partner violence:    Fear of current or ex partner: Not on file    Emotionally abused: Not on file    Physically abused: Not on file    Forced sexual activity: Not on file  Other Topics Concern  . Not on file  Social History Narrative  . Not on file    Past Surgical History:  Procedure Laterality Date  . APPENDECTOMY    . CHOLECYSTECTOMY    . REPLACEMENT TOTAL KNEE     Rt leg  . TONSILLECTOMY AND ADENOIDECTOMY      Family History  Problem Relation Age of Onset  . Emphysema Mother     Allergies  Allergen Reactions  .  Codeine     UPSET STOMACH    Current Outpatient Medications on File Prior to Visit  Medication Sig Dispense Refill  . Ascorbic Acid (VITAMIN C PO) Take by mouth.    . Cyanocobalamin (B-12 PO) Take by mouth.    . gabapentin (NEURONTIN) 400 MG capsule Take 1 capsule (400 mg total) by mouth 4 (four) times daily. 360 capsule 1  . ibuprofen (ADVIL,MOTRIN) 600 MG tablet TAKE 1 TABLET EVERY 8 HOURS AS NEEDED 90 tablet 1  . losartan-hydrochlorothiazide (HYZAAR) 50-12.5 MG tablet Take 1 tablet by mouth daily. 90 tablet 1  . mirtazapine (REMERON) 15 MG tablet Take 1 tablet (15 mg total) by mouth at bedtime. 90 tablet 1  . MORPHABOND ER 60 MG T12A Take 1 tablet by mouth 2 (two) times daily.  0  . Multiple Vitamin (MULTIVITAMIN) tablet Take 1 tablet by mouth daily.    Marland Kitchen NARCAN 4 MG/0.1ML LIQD nasal spray kit INHALE 1 SPRAY AS NEEDED FOR ACCIDENTAL OVERDOSE  0   No current facility-administered medications on file prior to visit.     BP 128/76   Temp 97.7 F (36.5 C) (Oral)   Wt 181 lb (82.1 kg)   BMI 28.35 kg/m       Objective:   Physical Exam    Constitutional: He is oriented to person, place, and time. He appears well-developed and well-nourished. No distress.  Cardiovascular: Normal rate, regular rhythm, normal heart sounds and intact distal pulses. Exam reveals no gallop and no friction rub.  No murmur heard. Pulmonary/Chest: Effort normal and breath sounds normal. No respiratory distress. He has no wheezes. He has no rales. He exhibits no tenderness.  Neurological: He is alert and oriented to person, place, and time.  Skin: Skin is warm and dry. No rash noted. He is not diaphoretic. No erythema. No pallor.  Psychiatric: He has a normal mood and affect. His behavior is normal. Judgment and thought content normal.  Nursing note and vitals reviewed.     Assessment & Plan:  1. Erectile dysfunction, unspecified erectile dysfunction type - likely due to narcotic medications he is on  - Consider sending to urology  - Testosterone Total,Free,Bio, Males - Hepatic function panel  2. Other fatigue - Testosterone Total,Free,Bio, Males - TSH   [name], NP

## 2017-10-23 ENCOUNTER — Other Ambulatory Visit: Payer: Self-pay | Admitting: Adult Health

## 2017-10-23 DIAGNOSIS — R7989 Other specified abnormal findings of blood chemistry: Secondary | ICD-10-CM

## 2017-10-23 LAB — TESTOSTERONE TOTAL,FREE,BIO, MALES
Albumin: 4.6 g/dL (ref 3.6–5.1)
SEX HORMONE BINDING: 59 nmol/L (ref 22–77)
Testosterone: 221 ng/dL — ABNORMAL LOW (ref 250–827)

## 2017-11-12 DIAGNOSIS — M1990 Unspecified osteoarthritis, unspecified site: Secondary | ICD-10-CM | POA: Diagnosis not present

## 2017-11-12 DIAGNOSIS — Z79899 Other long term (current) drug therapy: Secondary | ICD-10-CM | POA: Diagnosis not present

## 2017-11-14 ENCOUNTER — Telehealth: Payer: Self-pay | Admitting: Adult Health

## 2017-11-14 DIAGNOSIS — I10 Essential (primary) hypertension: Secondary | ICD-10-CM

## 2017-11-14 NOTE — Telephone Encounter (Signed)
Patient is concerned about the recall on Losartan so he is wondering if he is supposed to keep taking it? Please respond to patient accordingly.  If pt is to keep taking Losartan he needs a refill sent in to [facility] mail order.  Fax [phone]

## 2017-11-18 MED ORDER — LOSARTAN POTASSIUM-HCTZ 50-12.5 MG PO TABS: 1.0000 | ORAL_TABLET | Freq: Every day | ORAL | 1 refills | Status: AC

## 2017-11-18 NOTE — Telephone Encounter (Signed)
Spoke to the pt and advised that he will need to call his pharmacy to find out if his lot # is on the recall list.  Pt will call back if medication needs to be changed.  Medication was sent to the pharmacy.

## 2017-11-24 ENCOUNTER — Other Ambulatory Visit: Payer: Self-pay | Admitting: Adult Health

## 2017-11-25 NOTE — Telephone Encounter (Signed)
Patient Instructions by [name], NP at 06/27/2017 11:00 AM   Author: [name], NP Author Type: Nurse Practitioner Filed: 06/27/2017 11:33 AM  Note Status: Signed Cosign: Cosign Not Required Encounter Date: 06/27/2017  Editor: [name], NP (Nurse Practitioner)    It was great meeting you today   I will follow up with you regarding your labs and will send in appropriate medications.   Someone from pain management will call you to schedule your appointment   Follow up with me in November for your physical exam         Sent to the pharmacy by e-scribe for 6 months.

## 2017-12-02 DIAGNOSIS — R948 Abnormal results of function studies of other organs and systems: Secondary | ICD-10-CM | POA: Diagnosis not present

## 2017-12-02 DIAGNOSIS — N5201 Erectile dysfunction due to arterial insufficiency: Secondary | ICD-10-CM | POA: Diagnosis not present

## 2017-12-02 DIAGNOSIS — E291 Testicular hypofunction: Secondary | ICD-10-CM | POA: Diagnosis not present

## 2017-12-11 ENCOUNTER — Encounter: Payer: Self-pay | Admitting: Family Medicine

## 2017-12-12 DIAGNOSIS — Z79899 Other long term (current) drug therapy: Secondary | ICD-10-CM | POA: Diagnosis not present

## 2017-12-12 DIAGNOSIS — M1990 Unspecified osteoarthritis, unspecified site: Secondary | ICD-10-CM | POA: Diagnosis not present

## 2017-12-31 ENCOUNTER — Ambulatory Visit: Payer: Self-pay

## 2017-12-31 NOTE — Telephone Encounter (Signed)
Noted  

## 2017-12-31 NOTE — Telephone Encounter (Signed)
Pt. Reports he noticed swelling to his ankles and lower legs 3 weeks ago. No chest pain or shortness of breath. Swelling is painful - no redness to skin.Pt. Thinks it could be related to his testosterone injections.Appointment made for tomorrow. Instructed if he develops chest pain or shortness of breath, to go to ED. Verbalizes understanding.  Reason for Disposition . [1] MODERATE leg swelling (e.g., swelling extends up to knees) AND [2] new onset or worsening  Answer Assessment - Initial Assessment Questions 1. ONSET: "When did the swelling start?" (e.g., minutes, hours, days)     3 weeks ago 2. LOCATION: "What part of the leg is swollen?"  "Are both legs swollen or just one leg?"     Ankles and 6 inches above 3. SEVERITY: "How bad is the swelling?" (e.g., localized; mild, moderate, severe)  - Localized - small area of swelling localized to one leg  - MILD pedal edema - swelling limited to foot and ankle, pitting edema < 1/4 inch (6 mm) deep, rest and elevation eliminate most or all swelling  - MODERATE edema - swelling of lower leg to knee, pitting edema > 1/4 inch (6 mm) deep, rest and elevation only partially reduce swelling  - SEVERE edema - swelling extends above knee, facial or hand swelling present      Mild 4. REDNESS: "Does the swelling look red or infected?"     No 5. PAIN: "Is the swelling painful to touch?" If so, ask: "How painful is it?"   (Scale 1-10; mild, moderate or severe)     Moderate 6. FEVER: "Do you have a fever?" If so, ask: "What is it, how was it measured, and when did it start?"      No 7. CAUSE: "What do you think is causing the leg swelling?"     Unsure 8. MEDICAL HISTORY: "Do you have a history of heart failure, kidney disease, liver failure, or cancer?"     No 9. RECURRENT SYMPTOM: "Have you had leg swelling before?" If so, ask: "When was the last time?" "What happened that time?"     No 10. OTHER SYMPTOMS: "Do you have any other symptoms?" (e.g., chest  pain, difficulty breathing)       No 11. PREGNANCY: "Is there any chance you are pregnant?" "When was your last menstrual period?"       n/a  Protocols used: LEG SWELLING AND EDEMA-A-AH

## 2018-01-01 ENCOUNTER — Encounter: Payer: Self-pay | Admitting: Adult Health

## 2018-01-01 ENCOUNTER — Ambulatory Visit (INDEPENDENT_AMBULATORY_CARE_PROVIDER_SITE_OTHER): Payer: Medicare HMO | Admitting: Adult Health

## 2018-01-01 VITALS — BP 144/80 | HR 71 | Temp 98.3°F | Wt 184.0 lb

## 2018-01-01 DIAGNOSIS — Z760 Encounter for issue of repeat prescription: Secondary | ICD-10-CM

## 2018-01-01 DIAGNOSIS — R600 Localized edema: Secondary | ICD-10-CM | POA: Diagnosis not present

## 2018-01-01 LAB — BASIC METABOLIC PANEL
BUN: 11 mg/dL (ref 6–23)
CHLORIDE: 103 meq/L (ref 96–112)
CO2: 31 mEq/L (ref 19–32)
Calcium: 9.3 mg/dL (ref 8.4–10.5)
Creatinine, Ser: 1.07 mg/dL (ref 0.40–1.50)
GFR: 72.25 mL/min (ref >60.00)
Glucose, Bld: 88 mg/dL (ref 70–99)
POTASSIUM: 3.8 meq/L (ref 3.5–5.1)
SODIUM: 142 meq/L (ref 135–145)

## 2018-01-01 LAB — BRAIN NATRIURETIC PEPTIDE: PRO B NATRI PEPTIDE: 101 pg/mL — AB (ref 0.0–100.0)

## 2018-01-01 MED ORDER — HYDROCHLOROTHIAZIDE 12.5 MG PO CAPS: 12.5000 mg | ORAL_CAPSULE | Freq: Every day | ORAL | 1 refills | Status: DC

## 2018-01-01 MED ORDER — GABAPENTIN 400 MG PO CAPS: 400.0000 mg | ORAL_CAPSULE | Freq: Four times a day (QID) | ORAL | 2 refills | Status: AC

## 2018-01-01 NOTE — Patient Instructions (Signed)
It was great seeing you today   I have added 12.5 mg of HCTZ to help get that fluid off your feet   Please follow up in 3 weeks   Someone will call you to schedule your echo

## 2018-01-01 NOTE — Progress Notes (Signed)
Subjective:    Patient ID: Zachary Cordova, male    DOB: June 15, 1946, 72 y.o.   MRN: [id number]  HPI 72 year old male who  has a past medical history of Arthritis, Chicken pox, Colon polyps, Diverticulitis, Hepatitis C, History of blood transfusion, Hypertension, Measles, Mumps, and Rheumatoid arthritis ([place]).  He presents to the office today for two weeks of lower extremity edema. Reports sudden onset.  Denies any CP or worsening SOB. He continues to smoke. Denies any pain or redness in his calf. Swelling seems to be improved first thing in the morning but becomes worse as the day goes on. He has not been eating a lot of salt.   He also needs his gabapentin refilled - as he has been out of about a week and a half.   Review of Systems See HPI   Past Medical History:  Diagnosis Date  . Arthritis    Lower spine, left knee and hands  . Chicken pox   . Colon polyps   . Diverticulitis   . Hepatitis C   . History of blood transfusion   . Hypertension   . Measles    Between 40-85 years of age  . Mumps    72 years of age  . Rheumatoid arthritis ([place])     Social History   Socioeconomic History  . Marital status: Single    Spouse name: Not on file  . Number of children: Not on file  . Years of education: Not on file  . Highest education level: Not on file  Occupational History  . Not on file  Social Needs  . Financial resource strain: Not on file  . Food insecurity:    Worry: Not on file    Inability: Not on file  . Transportation needs:    Medical: Not on file    Non-medical: Not on file  Tobacco Use  . Smoking status: Current Every Day Smoker  . Smokeless tobacco: Never Used  Substance and Sexual Activity  . Alcohol use: Yes    Alcohol/week: 3.0 oz    Types: 4 Cans of beer, 1 Shots of liquor per week  . Drug use: No  . Sexual activity: Not on file  Lifestyle  . Physical activity:    Days per week: Not on file    Minutes per session: Not on file  . Stress:  Not on file  Relationships  . Social connections:    Talks on phone: Not on file    Gets together: Not on file    Attends religious service: Not on file    Active member of club or organization: Not on file    Attends meetings of clubs or organizations: Not on file    Relationship status: Not on file  . Intimate partner violence:    Fear of current or ex partner: Not on file    Emotionally abused: Not on file    Physically abused: Not on file    Forced sexual activity: Not on file  Other Topics Concern  . Not on file  Social History Narrative  . Not on file    Past Surgical History:  Procedure Laterality Date  . APPENDECTOMY    . CHOLECYSTECTOMY    . REPLACEMENT TOTAL KNEE     Rt leg  . TONSILLECTOMY AND ADENOIDECTOMY      Family History  Problem Relation Age of Onset  . Emphysema Mother     Allergies  Allergen Reactions  .  Codeine     UPSET STOMACH    Current Outpatient Medications on File Prior to Visit  Medication Sig Dispense Refill  . Ascorbic Acid (VITAMIN C PO) Take by mouth.    . Cyanocobalamin (B-12 PO) Take by mouth.    Marland Kitchen ibuprofen (ADVIL,MOTRIN) 600 MG tablet TAKE 1 TABLET EVERY 8 HOURS AS NEEDED 90 tablet 1  . losartan-hydrochlorothiazide (HYZAAR) 50-12.5 MG tablet Take 1 tablet by mouth daily. 90 tablet 1  . mirtazapine (REMERON) 15 MG tablet TAKE 1 TABLET AT BEDTIME 90 tablet 1  . MORPHABOND ER 60 MG T12A Take 1 tablet by mouth 2 (two) times daily.  0  . Multiple Vitamin (MULTIVITAMIN) tablet Take 1 tablet by mouth daily.    Marland Kitchen NARCAN 4 MG/0.1ML LIQD nasal spray kit INHALE 1 SPRAY AS NEEDED FOR ACCIDENTAL OVERDOSE  0  . Testosterone Cypionate 200 MG/ML SOLN Inject 1 mL as directed every 14 (fourteen) days.    Marland Kitchen triamcinolone ointment (KENALOG) 0.5 % Apply 1 application topically 2 (two) times daily. 30 g 2   No current facility-administered medications on file prior to visit.     BP (!) 144/80   Pulse 71   Temp 98.3 F (36.8 C)   Wt 184 lb  (83.5 kg)   SpO2 96%   BMI 28.82 kg/m       Objective:   Physical Exam  Constitutional: He is oriented to person, place, and time. He appears well-developed and well-nourished. No distress.  Cardiovascular: Normal rate, regular rhythm, normal heart sounds and intact distal pulses. Exam reveals no friction rub.  No murmur heard. Pulmonary/Chest: Effort normal. He has wheezes.  Abdominal: Soft. Bowel sounds are normal.  Musculoskeletal: He exhibits edema.  + 1 pitting edema from feet to ankles bilaterally. No redness, warmth, or tenderness to calf   Neurological: He is alert and oriented to person, place, and time.  Skin: Skin is warm and dry. Capillary refill takes less than 2 seconds. He is not diaphoretic.  Psychiatric: He has a normal mood and affect. His behavior is normal. Judgment and thought content normal.  Nursing note and vitals reviewed.     Assessment & Plan:  1. Essential hypertension  - Basic Metabolic Panel - Brain Natriuretic Peptide - ECHOCARDIOGRAM LIMITED; Future - hydrochlorothiazide (MICROZIDE) 12.5 MG capsule; Take 1 capsule (12.5 mg total) by mouth daily.  Dispense: 90 capsule; Refill: 1  2. Medication refill  - gabapentin (NEURONTIN) 400 MG capsule; Take 1 capsule (400 mg total) by mouth 4 (four) times daily.  Dispense: 360 capsule; Refill: 2  [name], NP

## 2018-01-09 DIAGNOSIS — G8929 Other chronic pain: Secondary | ICD-10-CM | POA: Diagnosis not present

## 2018-01-09 DIAGNOSIS — G894 Chronic pain syndrome: Secondary | ICD-10-CM | POA: Diagnosis not present

## 2018-01-09 DIAGNOSIS — M545 Low back pain: Secondary | ICD-10-CM | POA: Diagnosis not present

## 2018-01-09 DIAGNOSIS — M1990 Unspecified osteoarthritis, unspecified site: Secondary | ICD-10-CM | POA: Diagnosis not present

## 2018-01-09 DIAGNOSIS — Z79899 Other long term (current) drug therapy: Secondary | ICD-10-CM | POA: Diagnosis not present

## 2018-01-22 ENCOUNTER — Ambulatory Visit (INDEPENDENT_AMBULATORY_CARE_PROVIDER_SITE_OTHER): Payer: Medicare HMO | Admitting: Adult Health

## 2018-01-22 ENCOUNTER — Ambulatory Visit (INDEPENDENT_AMBULATORY_CARE_PROVIDER_SITE_OTHER)
Admission: RE | Admit: 2018-01-22 | Discharge: 2018-01-22 | Disposition: A | Discharge status: Home / Self Care | Payer: Medicare HMO | Source: Ambulatory Visit | Attending: Adult Health | Admitting: Adult Health

## 2018-01-22 ENCOUNTER — Encounter: Payer: Self-pay | Admitting: Adult Health

## 2018-01-22 ENCOUNTER — Telehealth: Payer: Self-pay | Admitting: Adult Health

## 2018-01-22 VITALS — BP 160/92 | Temp 97.9°F | Wt 193.0 lb

## 2018-01-22 DIAGNOSIS — R600 Localized edema: Secondary | ICD-10-CM

## 2018-01-22 DIAGNOSIS — J449 Chronic obstructive pulmonary disease, unspecified: Secondary | ICD-10-CM | POA: Diagnosis not present

## 2018-01-22 LAB — BASIC METABOLIC PANEL
BUN: 13 mg/dL (ref 6–23)
CO2: 29 meq/L (ref 19–32)
Calcium: 9.2 mg/dL (ref 8.4–10.5)
Chloride: 101 mEq/L (ref 96–112)
Creatinine, Ser: 0.94 mg/dL (ref 0.40–1.50)
GFR: 83.88 mL/min (ref >60.00)
GLUCOSE: 111 mg/dL — AB (ref 70–99)
POTASSIUM: 3.7 meq/L (ref 3.5–5.1)
SODIUM: 140 meq/L (ref 135–145)

## 2018-01-22 LAB — CBC WITH DIFFERENTIAL/PLATELET
BASOS PCT: 0.3 % (ref 0.0–3.0)
Basophils Absolute: 0 10*3/uL (ref 0.0–0.1)
EOS PCT: 1.6 % (ref 0.0–5.0)
Eosinophils Absolute: 0.1 10*3/uL (ref 0.0–0.7)
HCT: 49 % (ref 39.0–52.0)
Hemoglobin: 16.7 g/dL (ref 13.0–17.0)
Lymphocytes Relative: 20.7 % (ref 12.0–46.0)
Lymphs Abs: 1.2 10*3/uL (ref 0.7–4.0)
MCHC: 34.2 g/dL (ref 30.0–36.0)
MCV: 97.8 fl (ref 78.0–100.0)
MONO ABS: 0.5 10*3/uL (ref 0.1–1.0)
MONOS PCT: 8.3 % (ref 3.0–12.0)
NEUTROS ABS: 3.9 10*3/uL (ref 1.4–7.7)
Neutrophils Relative %: 69.1 % (ref 43.0–77.0)
PLATELETS: 172 10*3/uL (ref 150.0–400.0)
RBC: 5.01 Mil/uL (ref 4.22–5.81)
RDW: 13.2 % (ref 11.5–15.5)
WBC: 5.6 10*3/uL (ref 4.0–10.5)

## 2018-01-22 LAB — BRAIN NATRIURETIC PEPTIDE: Pro B Natriuretic peptide (BNP): 117 pg/mL — ABNORMAL HIGH (ref 0.0–100.0)

## 2018-01-22 MED ORDER — FUROSEMIDE 20 MG PO TABS: 20.0000 mg | ORAL_TABLET | Freq: Every day | ORAL | 3 refills | Status: DC

## 2018-01-22 MED ORDER — POTASSIUM CHLORIDE ER 10 MEQ PO TBCR: 10.0000 meq | EXTENDED_RELEASE_TABLET | Freq: Every day | ORAL | 1 refills | Status: DC

## 2018-01-22 NOTE — Telephone Encounter (Signed)
Vara Guardian from [facility] called to give a called report of a chest x-ray on 01/22/18. She says it shows: Small LEFT perihilar nodule and/or chronic bronchitic change. Neoplastic nodule not excluded. Recommend chest CT for further characterization. Result in chart.

## 2018-01-22 NOTE — Addendum Note (Signed)
Addended by: Rene Kocher on: 01/22/2018 05:29 PM   Modules accepted: Orders

## 2018-01-22 NOTE — Patient Instructions (Addendum)
I am going to get a chest xray and some more blood work   Stop the extra dose of HCTZ that we prescribed last time   I have sent in a prescription for lasix and potassium   Please call your urologist and see if he thinks that the testosterone is causing the fluid gain   Please follow up with me next week

## 2018-01-22 NOTE — Telephone Encounter (Signed)
Spoke to patient and informed him of his labs and chest x-ray.  Does have a small nodule on left lung and it is recommended that we get a CAT scan of his chest for further evaluation.  He is okay with this.  I am to see him next week for lower extremity edema and will wait to order then in case we need to get a CT of the abdomen and pelvis as well

## 2018-01-22 NOTE — Progress Notes (Signed)
Subjective:    Patient ID: Zachary Cordova, male    DOB: 1945-09-04, 72 y.o.   MRN: [id number]  HPI  72 year old male who  has a past medical history of Arthritis, Chicken pox, Colon polyps, Diverticulitis, Hepatitis C, History of blood transfusion, Hypertension, Measles, Mumps, and Rheumatoid arthritis ([place]). He presents to the office today for 3-week follow-up regarding lower extremity edema.  When I last saw him he complained of lower extremity edema this started approximately 2 weeks prior.  At time he reported that edema seem to progress as the day went on.  He denied any shortness of breath or chest pain at this time and had not no changes in his diet, he does not eat a lot of sodium.  The decision was made to start him on hydrochlorothiazide 12.5 mg tablets  Today in the office he reports that he has been taking his diuretic but despite this he continues to have worsening edema. His weight is up about 9 pounds over the last three weeks. He denies any chest pain but has had intermittent episodes of worsening shortness of breath. Denies any calf pain, tenderness, or warmth.   His BNP was relatively normal when we checked at this time.   He is receiving testosterone replacement therapy.   Has echo scheduled for later this month  Wt Readings from Last 3 Encounters:  01/22/18 193 lb (87.5 kg)  01/01/18 184 lb (83.5 kg)  10/22/17 181 lb (82.1 kg)    Review of Systems See HPI   Past Medical History:  Diagnosis Date  . Arthritis    Lower spine, left knee and hands  . Chicken pox   . Colon polyps   . Diverticulitis   . Hepatitis C   . History of blood transfusion   . Hypertension   . Measles    Between 5-28 years of age  . Mumps    72 years of age  . Rheumatoid arthritis ([place])     Social History   Socioeconomic History  . Marital status: Single    Spouse name: Not on file  . Number of children: Not on file  . Years of education: Not on file  . Highest  education level: Not on file  Occupational History  . Not on file  Social Needs  . Financial resource strain: Not on file  . Food insecurity:    Worry: Not on file    Inability: Not on file  . Transportation needs:    Medical: Not on file    Non-medical: Not on file  Tobacco Use  . Smoking status: Current Every Day Smoker  . Smokeless tobacco: Never Used  Substance and Sexual Activity  . Alcohol use: Yes    Alcohol/week: 3.0 oz    Types: 4 Cans of beer, 1 Shots of liquor per week  . Drug use: No  . Sexual activity: Not on file  Lifestyle  . Physical activity:    Days per week: Not on file    Minutes per session: Not on file  . Stress: Not on file  Relationships  . Social connections:    Talks on phone: Not on file    Gets together: Not on file    Attends religious service: Not on file    Active member of club or organization: Not on file    Attends meetings of clubs or organizations: Not on file    Relationship status: Not on file  . Intimate partner  violence:    Fear of current or ex partner: Not on file    Emotionally abused: Not on file    Physically abused: Not on file    Forced sexual activity: Not on file  Other Topics Concern  . Not on file  Social History Narrative  . Not on file    Past Surgical History:  Procedure Laterality Date  . APPENDECTOMY    . CHOLECYSTECTOMY    . REPLACEMENT TOTAL KNEE     Rt leg  . TONSILLECTOMY AND ADENOIDECTOMY      Family History  Problem Relation Age of Onset  . Emphysema Mother     Allergies  Allergen Reactions  . Codeine     UPSET STOMACH    Current Outpatient Medications on File Prior to Visit  Medication Sig Dispense Refill  . Ascorbic Acid (VITAMIN C PO) Take by mouth.    . Cyanocobalamin (B-12 PO) Take by mouth.    . gabapentin (NEURONTIN) 400 MG capsule Take 1 capsule (400 mg total) by mouth 4 (four) times daily. 360 capsule 2  . hydrochlorothiazide (MICROZIDE) 12.5 MG capsule Take 1 capsule (12.5 mg  total) by mouth daily. 90 capsule 1  . ibuprofen (ADVIL,MOTRIN) 600 MG tablet TAKE 1 TABLET EVERY 8 HOURS AS NEEDED 90 tablet 1  . losartan-hydrochlorothiazide (HYZAAR) 50-12.5 MG tablet Take 1 tablet by mouth daily. 90 tablet 1  . mirtazapine (REMERON) 15 MG tablet TAKE 1 TABLET AT BEDTIME 90 tablet 1  . MORPHABOND ER 60 MG T12A Take 1 tablet by mouth 2 (two) times daily.  0  . Multiple Vitamin (MULTIVITAMIN) tablet Take 1 tablet by mouth daily.    Marland Kitchen NARCAN 4 MG/0.1ML LIQD nasal spray kit INHALE 1 SPRAY AS NEEDED FOR ACCIDENTAL OVERDOSE  0  . Testosterone Cypionate 200 MG/ML SOLN Inject 1 mL as directed every 14 (fourteen) days.    Marland Kitchen triamcinolone ointment (KENALOG) 0.5 % Apply 1 application topically 2 (two) times daily. 30 g 2   No current facility-administered medications on file prior to visit.     BP (!) 160/92   Temp 97.9 F (36.6 C) (Oral)   Wt 193 lb (87.5 kg)   BMI 30.23 kg/m       Objective:   Physical Exam  Constitutional: He is oriented to person, place, and time. He appears well-developed and well-nourished. No distress.  Cardiovascular: Normal rate, regular rhythm, normal heart sounds and intact distal pulses. Exam reveals no gallop and no friction rub.  No murmur heard. Pulmonary/Chest: Effort normal. No stridor. No respiratory distress. He has wheezes. He has no rales. He exhibits no tenderness.  Musculoskeletal: He exhibits edema. He exhibits no tenderness or deformity.  +2 pitting edema in bilateral lower extremities. No calf pain, redness, or tenderness noted.  Noted non pitting edema throughout face.   Neurological: He is alert and oriented to person, place, and time.  Skin: Skin is warm and dry. Capillary refill takes less than 2 seconds. He is not diaphoretic.  Psychiatric: He has a normal mood and affect. His behavior is normal. Judgment and thought content normal.  Nursing note and vitals reviewed.   Wt Readings from Last 3 Encounters:  01/22/18 193 lb  (87.5 kg)  01/01/18 184 lb (83.5 kg)  10/22/17 181 lb (82.1 kg)      Assessment & Plan:  1. Lower extremity edema - I doubt it is related to testosterone but advised to call urology to make sure. No concern for DVT  or PE at this time. Will d/c hctz 12.5 mg and start on lasix 20 mg daily. Will repeat labs and get chest xray today.  Consider CT abdomen/pelvis if not resolved by next week. Consider cardiology and or pulmonary and or vein and vascular consult in the future.  - Follow up in one week  - Brain Natriuretic Peptide - Basic Metabolic Panel - CBC with Differential/Platelet - DG Chest 2 View; Future - potassium chloride (K-DUR) 10 MEQ tablet; Take 1 tablet (10 mEq total) by mouth daily.  Dispense: 30 tablet; Refill: 1  [name], NP

## 2018-01-23 LAB — HEPATIC FUNCTION PANEL
ALBUMIN: 4.1 g/dL (ref 3.5–5.2)
ALK PHOS: 66 U/L (ref 39–117)
ALT: 15 U/L (ref 0–53)
AST: 23 U/L (ref 0–37)
BILIRUBIN TOTAL: 0.7 mg/dL (ref 0.2–1.2)
Bilirubin, Direct: 0.2 mg/dL (ref 0.0–0.3)
Total Protein: 6.6 g/dL (ref 6.0–8.3)

## 2018-01-29 ENCOUNTER — Encounter: Payer: Self-pay | Admitting: Family Medicine

## 2018-01-29 ENCOUNTER — Encounter: Payer: Self-pay | Admitting: Adult Health

## 2018-01-29 ENCOUNTER — Ambulatory Visit (INDEPENDENT_AMBULATORY_CARE_PROVIDER_SITE_OTHER): Payer: Medicare HMO | Admitting: Adult Health

## 2018-01-29 VITALS — BP 180/88 | HR 71 | Temp 98.1°F | Wt 191.0 lb

## 2018-01-29 DIAGNOSIS — R600 Localized edema: Secondary | ICD-10-CM | POA: Diagnosis not present

## 2018-01-29 DIAGNOSIS — R911 Solitary pulmonary nodule: Secondary | ICD-10-CM

## 2018-01-29 DIAGNOSIS — D492 Neoplasm of unspecified behavior of bone, soft tissue, and skin: Secondary | ICD-10-CM | POA: Diagnosis not present

## 2018-01-29 LAB — BASIC METABOLIC PANEL
BUN: 17 mg/dL (ref 6–23)
CHLORIDE: 104 meq/L (ref 96–112)
CO2: 29 mEq/L (ref 19–32)
CREATININE: 1.01 mg/dL (ref 0.40–1.50)
Calcium: 9.2 mg/dL (ref 8.4–10.5)
GFR: 77.21 mL/min (ref >60.00)
GLUCOSE: 107 mg/dL — AB (ref 70–99)
Potassium: 4.2 mEq/L (ref 3.5–5.1)
Sodium: 140 mEq/L (ref 135–145)

## 2018-01-29 NOTE — Progress Notes (Signed)
Subjective:    Patient ID: Zachary Cordova, male    DOB: Sep 27, 1945, 72 y.o.   MRN: [id number]  HPI  72 year old male who  has a past medical history of Arthritis, Chicken pox, Colon polyps, Diverticulitis, Hepatitis C, History of blood transfusion, Hypertension, Measles, Mumps, and Rheumatoid arthritis ([place]).  He presents to the office today for follow up regarding lower extremity edema. His symptoms had been present since the middle/end of June. He was trialed on an additional 12.5 mg of HCTZ which did not resolve the edema. He was then placed on Lasix 20 mg daily a week ago. Today he reports that he has noticed some improvement in his lower extremity edema but as the day goes on the swelling gets worse. He is urinating more. He has a Echo this coming up week.   Additionally, he had a chest xray last week which showed a small left perihilar nodule and/or chronic bronchitis change. It was recommended Chest CT for further characterization. He is ok with this plan   He does have a small area on his left nare that he reports " once or twice a year it will act up, my nostril will get sore, red, and weep." This has been happening for many years.   Review of Systems See HPI   Past Medical History:  Diagnosis Date  . Arthritis    Lower spine, left knee and hands  . Chicken pox   . Colon polyps   . Diverticulitis   . Hepatitis C   . History of blood transfusion   . Hypertension   . Measles    Between 29-80 years of age  . Mumps    72 years of age  . Rheumatoid arthritis ([place])     Social History   Socioeconomic History  . Marital status: Single    Spouse name: Not on file  . Number of children: Not on file  . Years of education: Not on file  . Highest education level: Not on file  Occupational History  . Not on file  Social Needs  . Financial resource strain: Not on file  . Food insecurity:    Worry: Not on file    Inability: Not on file  . Transportation needs:   Medical: Not on file    Non-medical: Not on file  Tobacco Use  . Smoking status: Current Every Day Smoker  . Smokeless tobacco: Never Used  Substance and Sexual Activity  . Alcohol use: Yes    Alcohol/week: 3.0 oz    Types: 4 Cans of beer, 1 Shots of liquor per week  . Drug use: No  . Sexual activity: Not on file  Lifestyle  . Physical activity:    Days per week: Not on file    Minutes per session: Not on file  . Stress: Not on file  Relationships  . Social connections:    Talks on phone: Not on file    Gets together: Not on file    Attends religious service: Not on file    Active member of club or organization: Not on file    Attends meetings of clubs or organizations: Not on file    Relationship status: Not on file  . Intimate partner violence:    Fear of current or ex partner: Not on file    Emotionally abused: Not on file    Physically abused: Not on file    Forced sexual activity: Not on file  Other  Topics Concern  . Not on file  Social History Narrative  . Not on file    Past Surgical History:  Procedure Laterality Date  . APPENDECTOMY    . CHOLECYSTECTOMY    . REPLACEMENT TOTAL KNEE     Rt leg  . TONSILLECTOMY AND ADENOIDECTOMY      Family History  Problem Relation Age of Onset  . Emphysema Mother     Allergies  Allergen Reactions  . Codeine     UPSET STOMACH    Current Outpatient Medications on File Prior to Visit  Medication Sig Dispense Refill  . Ascorbic Acid (VITAMIN C PO) Take by mouth.    . Cyanocobalamin (B-12 PO) Take by mouth.    . furosemide (LASIX) 20 MG tablet Take 1 tablet (20 mg total) by mouth daily. 30 tablet 3  . gabapentin (NEURONTIN) 400 MG capsule Take 1 capsule (400 mg total) by mouth 4 (four) times daily. 360 capsule 2  . ibuprofen (ADVIL,MOTRIN) 600 MG tablet TAKE 1 TABLET EVERY 8 HOURS AS NEEDED 90 tablet 1  . losartan-hydrochlorothiazide (HYZAAR) 50-12.5 MG tablet Take 1 tablet by mouth daily. 90 tablet 1  . mirtazapine  (REMERON) 15 MG tablet TAKE 1 TABLET AT BEDTIME 90 tablet 1  . MORPHABOND ER 60 MG T12A Take 1 tablet by mouth 2 (two) times daily.  0  . Multiple Vitamin (MULTIVITAMIN) tablet Take 1 tablet by mouth daily.    Marland Kitchen NARCAN 4 MG/0.1ML LIQD nasal spray kit INHALE 1 SPRAY AS NEEDED FOR ACCIDENTAL OVERDOSE  0  . potassium chloride (K-DUR) 10 MEQ tablet Take 1 tablet (10 mEq total) by mouth daily. 30 tablet 1  . Testosterone Cypionate 200 MG/ML SOLN Inject 1 mL as directed every 14 (fourteen) days.    Marland Kitchen triamcinolone ointment (KENALOG) 0.5 % Apply 1 application topically 2 (two) times daily. 30 g 2   No current facility-administered medications on file prior to visit.     BP (!) 180/88   Pulse 71   Temp 98.1 F (36.7 C) (Oral)   Wt 191 lb (86.6 kg)   SpO2 94%   BMI 29.91 kg/m       Objective:   Physical Exam  Constitutional: He is oriented to person, place, and time. He appears well-developed and well-nourished. No distress.  Cardiovascular: Normal rate, regular rhythm, normal heart sounds and intact distal pulses.  Pulmonary/Chest: Effort normal. No stridor. No respiratory distress. He has wheezes in the right upper field and the left upper field. He has no rales. He exhibits no tenderness.  Musculoskeletal: He exhibits edema and tenderness. He exhibits no deformity.  + 1 pitting edema in lower extremities.   Neurological: He is alert and oriented to person, place, and time.  Skin: He is not diaphoretic.  Small wound on left nare. Concern for [facility]  Psychiatric: He has a normal mood and affect. His behavior is normal. Judgment and thought content normal.  Nursing note and vitals reviewed.     Assessment & Plan:  1. Lower extremity edema - Has echo next week  - Will get CT abdomen and pelvis to r/o obstruction.  - Continue with lasix  - Basic Metabolic Panel - CT Abdomen Pelvis W Contrast; Future  2. Solitary pulmonary nodule  - CT Chest Wo Contrast; Future  3. Skin neoplasm  -  Ambulatory referral to Dermatology  [name], NP

## 2018-02-05 ENCOUNTER — Ambulatory Visit (HOSPITAL_COMMUNITY)
Admission: RE | Admit: 2018-02-05 | Discharge: 2018-02-05 | Disposition: A | Discharge status: Home / Self Care | Payer: Medicare HMO | Source: Ambulatory Visit | Attending: Adult Health | Admitting: Adult Health

## 2018-02-05 ENCOUNTER — Other Ambulatory Visit: Payer: Self-pay | Admitting: Adult Health

## 2018-02-05 DIAGNOSIS — R600 Localized edema: Secondary | ICD-10-CM

## 2018-02-05 DIAGNOSIS — I351 Nonrheumatic aortic (valve) insufficiency: Secondary | ICD-10-CM | POA: Diagnosis not present

## 2018-02-05 NOTE — Progress Notes (Signed)
  Echocardiogram 2D Echocardiogram has been performed.  Merrie Roof F 02/05/2018, 12:10 PM

## 2018-02-10 DIAGNOSIS — F1120 Opioid dependence, uncomplicated: Secondary | ICD-10-CM | POA: Diagnosis not present

## 2018-02-10 DIAGNOSIS — G894 Chronic pain syndrome: Secondary | ICD-10-CM | POA: Diagnosis not present

## 2018-02-10 DIAGNOSIS — Z79899 Other long term (current) drug therapy: Secondary | ICD-10-CM | POA: Diagnosis not present

## 2018-02-10 DIAGNOSIS — M1990 Unspecified osteoarthritis, unspecified site: Secondary | ICD-10-CM | POA: Diagnosis not present

## 2018-02-12 ENCOUNTER — Ambulatory Visit (INDEPENDENT_AMBULATORY_CARE_PROVIDER_SITE_OTHER)
Admission: RE | Admit: 2018-02-12 | Discharge: 2018-02-12 | Disposition: A | Discharge status: Home / Self Care | Payer: Medicare HMO | Source: Ambulatory Visit | Attending: Adult Health | Admitting: Adult Health

## 2018-02-12 DIAGNOSIS — K760 Fatty (change of) liver, not elsewhere classified: Secondary | ICD-10-CM | POA: Diagnosis not present

## 2018-02-12 DIAGNOSIS — R600 Localized edema: Secondary | ICD-10-CM | POA: Diagnosis not present

## 2018-02-12 DIAGNOSIS — J984 Other disorders of lung: Secondary | ICD-10-CM | POA: Diagnosis not present

## 2018-02-12 DIAGNOSIS — R911 Solitary pulmonary nodule: Secondary | ICD-10-CM

## 2018-02-12 MED ORDER — IOPAMIDOL (ISOVUE-300) INJECTION 61%
100.0000 mL | Freq: Once | INTRAVENOUS | Status: AC | PRN
  Administered 2018-02-12: 100 mL via INTRAVENOUS

## 2018-02-13 ENCOUNTER — Telehealth: Payer: Self-pay | Admitting: Adult Health

## 2018-02-13 DIAGNOSIS — R911 Solitary pulmonary nodule: Secondary | ICD-10-CM

## 2018-02-13 NOTE — Telephone Encounter (Signed)
Updated patient on results of CT chest and CT abdomen.   He does have a 15 mm somewhat nodular area noted inn the medial basal segment of the right lower lobe. It is recommended repeat CT in 3 months, PET-CT, or biopsy. He would like to be seen by Pulmonary to help decide what to do.   We discussed starting a statin medication due to atherosclerosis seen, he would like to hold off on this at this time.   He will come in for CPE within the next few weeks and will check lipid panel there

## 2018-02-20 ENCOUNTER — Ambulatory Visit (INDEPENDENT_AMBULATORY_CARE_PROVIDER_SITE_OTHER): Payer: Medicare HMO

## 2018-02-20 ENCOUNTER — Ambulatory Visit (INDEPENDENT_AMBULATORY_CARE_PROVIDER_SITE_OTHER): Payer: Medicare HMO | Admitting: Adult Health

## 2018-02-20 ENCOUNTER — Encounter: Payer: Self-pay | Admitting: Adult Health

## 2018-02-20 VITALS — BP 115/70 | HR 88 | Temp 98.3°F | Wt 191.0 lb

## 2018-02-20 DIAGNOSIS — I10 Essential (primary) hypertension: Secondary | ICD-10-CM

## 2018-02-20 DIAGNOSIS — R0602 Shortness of breath: Secondary | ICD-10-CM | POA: Diagnosis not present

## 2018-02-20 DIAGNOSIS — R600 Localized edema: Secondary | ICD-10-CM

## 2018-02-20 DIAGNOSIS — Z125 Encounter for screening for malignant neoplasm of prostate: Secondary | ICD-10-CM

## 2018-02-20 DIAGNOSIS — G4700 Insomnia, unspecified: Secondary | ICD-10-CM | POA: Diagnosis not present

## 2018-02-20 DIAGNOSIS — J439 Emphysema, unspecified: Secondary | ICD-10-CM

## 2018-02-20 DIAGNOSIS — Z0000 Encounter for general adult medical examination without abnormal findings: Secondary | ICD-10-CM | POA: Diagnosis not present

## 2018-02-20 DIAGNOSIS — R062 Wheezing: Secondary | ICD-10-CM

## 2018-02-20 DIAGNOSIS — J449 Chronic obstructive pulmonary disease, unspecified: Secondary | ICD-10-CM | POA: Insufficient documentation

## 2018-02-20 LAB — CBC WITH DIFFERENTIAL/PLATELET
BASOS ABS: 0 10*3/uL (ref 0.0–0.1)
Basophils Relative: 0.3 % (ref 0.0–3.0)
EOS ABS: 0.2 10*3/uL (ref 0.0–0.7)
Eosinophils Relative: 2 % (ref 0.0–5.0)
HCT: 51 % (ref 39.0–52.0)
Hemoglobin: 17.3 g/dL — ABNORMAL HIGH (ref 13.0–17.0)
Lymphocytes Relative: 30.2 % (ref 12.0–46.0)
Lymphs Abs: 2.4 10*3/uL (ref 0.7–4.0)
MCHC: 33.9 g/dL (ref 30.0–36.0)
MCV: 96.4 fl (ref 78.0–100.0)
MONO ABS: 0.6 10*3/uL (ref 0.1–1.0)
Monocytes Relative: 7.3 % (ref 3.0–12.0)
NEUTROS ABS: 4.8 10*3/uL (ref 1.4–7.7)
NEUTROS PCT: 60.2 % (ref 43.0–77.0)
PLATELETS: 200 10*3/uL (ref 150.0–400.0)
RBC: 5.29 Mil/uL (ref 4.22–5.81)
RDW: 12.2 % (ref 11.5–15.5)
WBC: 7.9 10*3/uL (ref 4.0–10.5)

## 2018-02-20 LAB — LIPID PANEL
CHOL/HDL RATIO: 3
Cholesterol: 186 mg/dL (ref 0–200)
HDL: 54.2 mg/dL (ref >39.00)
NONHDL: 131.72
TRIGLYCERIDES: 239 mg/dL — AB (ref 0.0–149.0)
VLDL: 47.8 mg/dL — ABNORMAL HIGH (ref 0.0–40.0)

## 2018-02-20 LAB — HEPATIC FUNCTION PANEL
ALT: 18 U/L (ref 0–53)
AST: 25 U/L (ref 0–37)
Albumin: 4.5 g/dL (ref 3.5–5.2)
Alkaline Phosphatase: 68 U/L (ref 39–117)
BILIRUBIN DIRECT: 0.1 mg/dL (ref 0.0–0.3)
BILIRUBIN TOTAL: 0.5 mg/dL (ref 0.2–1.2)
TOTAL PROTEIN: 7.2 g/dL (ref 6.0–8.3)

## 2018-02-20 LAB — BASIC METABOLIC PANEL
BUN: 22 mg/dL (ref 6–23)
CALCIUM: 10.1 mg/dL (ref 8.4–10.5)
CO2: 29 meq/L (ref 19–32)
CREATININE: 1.5 mg/dL (ref 0.40–1.50)
Chloride: 99 mEq/L (ref 96–112)
GFR: 48.91 mL/min — AB (ref >60.00)
GLUCOSE: 87 mg/dL (ref 70–99)
Potassium: 3.7 mEq/L (ref 3.5–5.1)
Sodium: 139 mEq/L (ref 135–145)

## 2018-02-20 LAB — TSH: TSH: 1.71 u[IU]/mL (ref 0.35–4.50)

## 2018-02-20 LAB — PSA: PSA: 0.51 ng/mL (ref 0.10–4.00)

## 2018-02-20 LAB — HEMOGLOBIN A1C: HEMOGLOBIN A1C: 5.5 % (ref 4.6–6.5)

## 2018-02-20 LAB — LDL CHOLESTEROL, DIRECT: Direct LDL: 107 mg/dL

## 2018-02-20 NOTE — Progress Notes (Signed)
Subjective:    Patient ID: Zachary Cordova, male    DOB: 26-Feb-1946, 72 y.o.   MRN: [id number]  HPI  Patient presents for yearly preventative medicine examination. He is a pleasant 72 year old male who  has a past medical history of Arthritis, Chicken pox, Colon polyps, Diverticulitis, Hepatitis C, History of blood transfusion, Hypertension, Measles, Mumps, and Rheumatoid arthritis ([place]).   Hypertension - Currently prescribed Hyzaar 50-12.5 mg.  Reports that he started taking his medication on a daily basis  Lower extremity edema - takes Lasix 20 mg daily. Reports lower extremity swelling has improved over the last month, since starting Lasix. Has been taking prescribed K-dur to prevent hypokalemia   Chronic Pain - is seen by pain clinic. Is prescribed Morphabone ER 60 mg daily. He feels stable on this medication   Insomnia - takes Remeron 15 mg- feels controlled on this   Low testosterone - Testosterone replacement managed my urology   Tobacco Use - continues to smoke. Would like to quit but does not want any help doing so   Pulmonary Nodule -was seen on recent CT of the chest dated 02/12/2018, report shows 15 mm area on the right lower lobe.  They recommended repeat chest CT or follow-up PET CT or tissue sampling.  He was referred to pulmonary for further evaluation but has not heard from them yet.  All immunizations and health maintenance protocols were reviewed with the patient and needed orders were placed. He is due for prevnar 13  Appropriate screening laboratory values were ordered for the patient including screening of hyperlipidemia, renal function and hepatic function. If indicated by BPH, a PSA was ordered.  Medication reconciliation,  past medical history, social history, problem list and allergies were reviewed in detail with the patient  Goals were established with regard to weight loss, exercise, and  diet in compliance with medications.  Does not exercise due to  chronic pain.  Reports trying to eat healthy but finds it difficult.   He has no complaints today and states "I actually feel pretty well today".   Review of Systems  Constitutional: Negative.   HENT: Negative.   Eyes: Negative.   Respiratory: Positive for shortness of breath and wheezing.   Cardiovascular: Positive for leg swelling.  Gastrointestinal: Negative.   Endocrine: Negative.   Genitourinary: Negative.   Musculoskeletal: Positive for arthralgias and back pain.  Skin: Negative.   Allergic/Immunologic: Negative.   Neurological: Negative.   Hematological: Negative.   Psychiatric/Behavioral: Negative.   All other systems reviewed and are negative.  Past Medical History:  Diagnosis Date  . Arthritis    Lower spine, left knee and hands  . Chicken pox   . Colon polyps   . Diverticulitis   . Hepatitis C   . History of blood transfusion   . Hypertension   . Measles    Between 57-32 years of age  . Mumps    72 years of age  . Rheumatoid arthritis ([place])     Social History   Socioeconomic History  . Marital status: Single    Spouse name: Not on file  . Number of children: Not on file  . Years of education: Not on file  . Highest education level: Not on file  Occupational History  . Not on file  Social Needs  . Financial resource strain: Not on file  . Food insecurity:    Worry: Not on file    Inability: Not on file  .  Transportation needs:    Medical: Not on file    Non-medical: Not on file  Tobacco Use  . Smoking status: Current Every Day Smoker  . Smokeless tobacco: Never Used  Substance and Sexual Activity  . Alcohol use: Yes    Alcohol/week: 5.0 standard drinks    Types: 4 Cans of beer, 1 Shots of liquor per week  . Drug use: No  . Sexual activity: Not on file  Lifestyle  . Physical activity:    Days per week: Not on file    Minutes per session: Not on file  . Stress: Not on file  Relationships  . Social connections:    Talks on phone: Not on  file    Gets together: Not on file    Attends religious service: Not on file    Active member of club or organization: Not on file    Attends meetings of clubs or organizations: Not on file    Relationship status: Not on file  . Intimate partner violence:    Fear of current or ex partner: Not on file    Emotionally abused: Not on file    Physically abused: Not on file    Forced sexual activity: Not on file  Other Topics Concern  . Not on file  Social History Narrative  . Not on file    Past Surgical History:  Procedure Laterality Date  . APPENDECTOMY    . CHOLECYSTECTOMY    . REPLACEMENT TOTAL KNEE     Rt leg  . TONSILLECTOMY AND ADENOIDECTOMY      Family History  Problem Relation Age of Onset  . Emphysema Mother     Allergies  Allergen Reactions  . Codeine     UPSET STOMACH    Current Outpatient Medications on File Prior to Visit  Medication Sig Dispense Refill  . Ascorbic Acid (VITAMIN C PO) Take by mouth.    . Cyanocobalamin (B-12 PO) Take by mouth.    . furosemide (LASIX) 20 MG tablet Take 1 tablet (20 mg total) by mouth daily. 30 tablet 3  . gabapentin (NEURONTIN) 400 MG capsule Take 1 capsule (400 mg total) by mouth 4 (four) times daily. 360 capsule 2  . ibuprofen (ADVIL,MOTRIN) 600 MG tablet TAKE 1 TABLET EVERY 8 HOURS AS NEEDED 90 tablet 1  . losartan-hydrochlorothiazide (HYZAAR) 50-12.5 MG tablet Take 1 tablet by mouth daily. 90 tablet 1  . mirtazapine (REMERON) 15 MG tablet TAKE 1 TABLET AT BEDTIME 90 tablet 1  . MORPHABOND ER 60 MG T12A Take 1 tablet by mouth 2 (two) times daily.  0  . Multiple Vitamin (MULTIVITAMIN) tablet Take 1 tablet by mouth daily.    Marland Kitchen NARCAN 4 MG/0.1ML LIQD nasal spray kit INHALE 1 SPRAY AS NEEDED FOR ACCIDENTAL OVERDOSE  0  . potassium chloride (K-DUR) 10 MEQ tablet Take 1 tablet (10 mEq total) by mouth daily. 30 tablet 1  . Testosterone Cypionate 200 MG/ML SOLN Inject 1 mL as directed every 14 (fourteen) days.    Marland Kitchen triamcinolone  ointment (KENALOG) 0.5 % Apply 1 application topically 2 (two) times daily. 30 g 2   No current facility-administered medications on file prior to visit.     BP 115/70 (BP Location: Left Arm, Patient Position: Sitting, Cuff Size: Normal)   Pulse 88   Temp 98.3 F (36.8 C)   Wt 191 lb (86.6 kg)   BMI 29.91 kg/m       Objective:   Physical Exam  Constitutional:  He is oriented to person, place, and time. He appears well-developed and well-nourished. No distress.  HENT:  Head: Normocephalic and atraumatic.  Right Ear: External ear normal.  Left Ear: External ear normal.  Nose: Nose normal.  Mouth/Throat: Oropharynx is clear and moist. No oropharyngeal exudate.  Eyes: Pupils are equal, round, and reactive to light. Conjunctivae and EOM are normal. Right eye exhibits no discharge. Left eye exhibits no discharge. No scleral icterus.  Neck: Normal range of motion. Neck supple. No JVD present. No tracheal deviation present. No thyromegaly present.  Cardiovascular: Normal rate, regular rhythm, normal heart sounds and intact distal pulses. Exam reveals no gallop and no friction rub.  No murmur heard. Pulmonary/Chest: Effort normal. No stridor. No respiratory distress. He has wheezes in the right upper field, the right middle field, the right lower field, the left upper field, the left middle field and the left lower field. He has rhonchi in the right upper field, the right middle field, the right lower field, the left upper field, the left middle field and the left lower field. He has no rales. He exhibits no tenderness.  Abdominal: Soft. Bowel sounds are normal. He exhibits no distension and no mass. There is no tenderness. There is no rebound and no guarding. No hernia.  Genitourinary:  Genitourinary Comments: Will do PSA  Musculoskeletal: Normal range of motion. He exhibits no edema, tenderness or deformity.  Lymphadenopathy:    He has no cervical adenopathy.  Neurological: He is alert and  oriented to person, place, and time. He displays normal reflexes. No cranial nerve deficit or sensory deficit. He exhibits normal muscle tone. Coordination normal.  Skin: Skin is warm and dry. Capillary refill takes less than 2 seconds. He is not diaphoretic.  Psychiatric: He has a normal mood and affect. His behavior is normal. Judgment and thought content normal.  Nursing note and vitals reviewed.     Assessment & Plan:  1. Routine general medical examination at a health care facility - Follow up in 1 year or sooner if needed - Basic metabolic panel - CBC with Differential/Platelet - Hemoglobin A1c - Hepatic function panel - Lipid panel - TSH  2. Prostate cancer screening  - PSA  3. Wheezing - DG Chest 2 View  4. Insomnia, unspecified type - Continue with Remeron   5. Lower extremity edema -Continue  with Lasix 20 mg twice daily - Basic metabolic panel - CBC with Differential/Platelet - Hepatic function panel - Lipid panel - TSH  6. Essential hypertension -Change in medication - Basic metabolic panel - CBC with Differential/Platelet - Hemoglobin A1c - Hepatic function panel - Lipid panel - TSH  7. Pulmonary emphysema, unspecified emphysema type ([place]) - Awaiting pulmonary visit for COPD and lung nodule.  Chest x-ray today due to worsening wheezing and rhonchi. - DG Chest 2 View  [name], NP

## 2018-03-04 ENCOUNTER — Other Ambulatory Visit: Payer: Self-pay | Admitting: Adult Health

## 2018-03-04 DIAGNOSIS — I10 Essential (primary) hypertension: Secondary | ICD-10-CM

## 2018-03-12 DIAGNOSIS — F1120 Opioid dependence, uncomplicated: Secondary | ICD-10-CM | POA: Diagnosis not present

## 2018-03-12 DIAGNOSIS — G894 Chronic pain syndrome: Secondary | ICD-10-CM | POA: Diagnosis not present

## 2018-03-12 DIAGNOSIS — M1990 Unspecified osteoarthritis, unspecified site: Secondary | ICD-10-CM | POA: Diagnosis not present

## 2018-03-12 DIAGNOSIS — Z79899 Other long term (current) drug therapy: Secondary | ICD-10-CM | POA: Diagnosis not present

## 2018-03-13 DIAGNOSIS — E291 Testicular hypofunction: Secondary | ICD-10-CM | POA: Diagnosis not present

## 2018-03-13 DIAGNOSIS — N5201 Erectile dysfunction due to arterial insufficiency: Secondary | ICD-10-CM | POA: Diagnosis not present

## 2018-03-21 ENCOUNTER — Other Ambulatory Visit: Payer: Self-pay | Admitting: Adult Health

## 2018-03-21 DIAGNOSIS — R600 Localized edema: Secondary | ICD-10-CM

## 2018-03-24 NOTE — Telephone Encounter (Signed)
Should patient continue? 

## 2018-03-24 NOTE — Telephone Encounter (Signed)
Yes, while he is taking Lasix

## 2018-03-26 NOTE — Progress Notes (Deleted)
Synopsis: Referred in Sept 2019  for  Zachary Peng, NP  Subjective:   PATIENT ID: Zachary Cordova GENDER: male DOB: 12-01-1945, MRN: [id number]  No chief complaint on file.   HPI  ***  Past Medical History:  Diagnosis Date  . Arthritis    Lower spine, left knee and hands  . Chicken pox   . Colon polyps   . Diverticulitis   . Hepatitis C   . History of blood transfusion   . Hypertension   . Measles    Between 52-72 years of age  . Mumps    72 years of age  . Rheumatoid arthritis ([place])      Family History  Problem Relation Age of Onset  . Emphysema Mother      Past Surgical History:  Procedure Laterality Date  . APPENDECTOMY    . CHOLECYSTECTOMY    . REPLACEMENT TOTAL KNEE     Rt leg  . TONSILLECTOMY AND ADENOIDECTOMY      Social History   Socioeconomic History  . Marital status: Single    Spouse name: Not on file  . Number of children: Not on file  . Years of education: Not on file  . Highest education level: Not on file  Occupational History  . Not on file  Social Needs  . Financial resource strain: Not on file  . Food insecurity:    Worry: Not on file    Inability: Not on file  . Transportation needs:    Medical: Not on file    Non-medical: Not on file  Tobacco Use  . Smoking status: Current Every Day Smoker  . Smokeless tobacco: Never Used  Substance and Sexual Activity  . Alcohol use: Yes    Alcohol/week: 5.0 standard drinks    Types: 4 Cans of beer, 1 Shots of liquor per week  . Drug use: No  . Sexual activity: Not on file  Lifestyle  . Physical activity:    Days per week: Not on file    Minutes per session: Not on file  . Stress: Not on file  Relationships  . Social connections:    Talks on phone: Not on file    Gets together: Not on file    Attends religious service: Not on file    Active member of club or organization: Not on file    Attends meetings of clubs or organizations: Not on file    Relationship status: Not  on file  . Intimate partner violence:    Fear of current or ex partner: Not on file    Emotionally abused: Not on file    Physically abused: Not on file    Forced sexual activity: Not on file  Other Topics Concern  . Not on file  Social History Narrative  . Not on file     Allergies  Allergen Reactions  . Codeine     UPSET STOMACH     Outpatient Medications Prior to Visit  Medication Sig Dispense Refill  . Ascorbic Acid (VITAMIN C PO) Take by mouth.    . Cyanocobalamin (B-12 PO) Take by mouth.    . furosemide (LASIX) 20 MG tablet Take 1 tablet (20 mg total) by mouth daily. 30 tablet 3  . gabapentin (NEURONTIN) 400 MG capsule Take 1 capsule (400 mg total) by mouth 4 (four) times daily. 360 capsule 2  . ibuprofen (ADVIL,MOTRIN) 600 MG tablet TAKE 1 TABLET EVERY 8 HOURS AS NEEDED 90 tablet 1  .  losartan (COZAAR) 25 MG tablet TAKE 1 TABLET BY MOUTH EVERYDAY AT BEDTIME 90 tablet 1  . losartan-hydrochlorothiazide (HYZAAR) 50-12.5 MG tablet Take 1 tablet by mouth daily. 90 tablet 1  . mirtazapine (REMERON) 15 MG tablet TAKE 1 TABLET AT BEDTIME 90 tablet 1  . MORPHABOND ER 60 MG T12A Take 1 tablet by mouth 2 (two) times daily.  0  . Multiple Vitamin (MULTIVITAMIN) tablet Take 1 tablet by mouth daily.    Marland Kitchen NARCAN 4 MG/0.1ML LIQD nasal spray kit INHALE 1 SPRAY AS NEEDED FOR ACCIDENTAL OVERDOSE  0  . potassium chloride (K-DUR) 10 MEQ tablet Take 1 tablet (10 mEq total) by mouth daily. Take with furosemide. 30 tablet 1  . Testosterone Cypionate 200 MG/ML SOLN Inject 1 mL as directed every 14 (fourteen) days.    Marland Kitchen triamcinolone ointment (KENALOG) 0.5 % Apply 1 application topically 2 (two) times daily. 30 g 2   No facility-administered medications prior to visit.     ROS   Objective:  Physical Exam   There were no vitals filed for this visit.   on *** LPM *** RA BMI Readings from Last 3 Encounters:  02/20/18 29.91 kg/m  01/29/18 29.91 kg/m  01/22/18 30.23 kg/m   Wt Readings  from Last 3 Encounters:  02/20/18 191 lb (86.6 kg)  01/29/18 191 lb (86.6 kg)  01/22/18 193 lb (87.5 kg)     CBC    Component Value Date/Time   WBC 7.9 02/20/2018 1411   RBC 5.29 02/20/2018 1411   HGB 17.3 (H) 02/20/2018 1411   HCT 51.0 02/20/2018 1411   PLT 200.0 02/20/2018 1411   MCV 96.4 02/20/2018 1411   MCHC 33.9 02/20/2018 1411   RDW 12.2 02/20/2018 1411   LYMPHSABS 2.4 02/20/2018 1411   MONOABS 0.6 02/20/2018 1411   EOSABS 0.2 02/20/2018 1411   BASOSABS 0.0 02/20/2018 1411    ***  Chest Imaging: 02/12/2018: RLL 77m lung nodule   Pulmonary Functions Testing Results:  FeNO: ***  Pathology: ***  Echocardiogram: ***  Heart Catheterization: ***    Assessment & Plan:   No diagnosis found.  Discussion: ***   Current Outpatient Medications:  .  Ascorbic Acid (VITAMIN C PO), Take by mouth., Disp: , Rfl:  .  Cyanocobalamin (B-12 PO), Take by mouth., Disp: , Rfl:  .  furosemide (LASIX) 20 MG tablet, Take 1 tablet (20 mg total) by mouth daily., Disp: 30 tablet, Rfl: 3 .  gabapentin (NEURONTIN) 400 MG capsule, Take 1 capsule (400 mg total) by mouth 4 (four) times daily., Disp: 360 capsule, Rfl: 2 .  ibuprofen (ADVIL,MOTRIN) 600 MG tablet, TAKE 1 TABLET EVERY 8 HOURS AS NEEDED, Disp: 90 tablet, Rfl: 1 .  losartan (COZAAR) 25 MG tablet, TAKE 1 TABLET BY MOUTH EVERYDAY AT BEDTIME, Disp: 90 tablet, Rfl: 1 .  losartan-hydrochlorothiazide (HYZAAR) 50-12.5 MG tablet, Take 1 tablet by mouth daily., Disp: 90 tablet, Rfl: 1 .  mirtazapine (REMERON) 15 MG tablet, TAKE 1 TABLET AT BEDTIME, Disp: 90 tablet, Rfl: 1 .  MORPHABOND ER 60 MG T12A, Take 1 tablet by mouth 2 (two) times daily., Disp: , Rfl: 0 .  Multiple Vitamin (MULTIVITAMIN) tablet, Take 1 tablet by mouth daily., Disp: , Rfl:  .  NARCAN 4 MG/0.1ML LIQD nasal spray kit, INHALE 1 SPRAY AS NEEDED FOR ACCIDENTAL OVERDOSE, Disp: , Rfl: 0 .  potassium chloride (K-DUR) 10 MEQ tablet, Take 1 tablet (10 mEq total) by mouth  daily. Take with furosemide., Disp: 30 tablet,  Rfl: 1 .  Testosterone Cypionate 200 MG/ML SOLN, Inject 1 mL as directed every 14 (fourteen) days., Disp: , Rfl:  .  triamcinolone ointment (KENALOG) 0.5 %, Apply 1 application topically 2 (two) times daily., Disp: 30 g, Rfl: 2   Garner Nash, DO [facility] 03/26/2018 9:10 PM

## 2018-03-27 ENCOUNTER — Telehealth: Payer: Self-pay

## 2018-03-27 ENCOUNTER — Institutional Professional Consult (permissible substitution): Payer: Medicare HMO | Admitting: Pulmonary Disease

## 2018-03-27 NOTE — Telephone Encounter (Signed)
LMTCB. Patietnt needs to reschedule consult appointment. Called and spoke witih daughter April, daughter states she will have the patient call the office. Nothing further needed at this time.

## 2018-04-14 ENCOUNTER — Other Ambulatory Visit: Payer: Self-pay | Admitting: Adult Health

## 2018-04-15 NOTE — Telephone Encounter (Signed)
Ok to do 90 days. Can we see when he is moving?

## 2018-04-15 NOTE — Telephone Encounter (Signed)
You stated previously that the pt was moving.  Not sure how much you want to authorize.

## 2018-04-22 ENCOUNTER — Encounter: Payer: Self-pay | Admitting: Adult Health

## 2018-04-22 ENCOUNTER — Ambulatory Visit (INDEPENDENT_AMBULATORY_CARE_PROVIDER_SITE_OTHER): Payer: Medicare HMO | Admitting: Adult Health

## 2018-04-22 ENCOUNTER — Other Ambulatory Visit: Payer: Self-pay | Admitting: Adult Health

## 2018-04-22 VITALS — BP 142/80 | HR 77 | Temp 98.4°F | Wt 191.0 lb

## 2018-04-22 DIAGNOSIS — R600 Localized edema: Secondary | ICD-10-CM

## 2018-04-22 DIAGNOSIS — G894 Chronic pain syndrome: Secondary | ICD-10-CM | POA: Diagnosis not present

## 2018-04-22 DIAGNOSIS — F1190 Opioid use, unspecified, uncomplicated: Secondary | ICD-10-CM | POA: Diagnosis not present

## 2018-04-22 DIAGNOSIS — Z79899 Other long term (current) drug therapy: Secondary | ICD-10-CM | POA: Diagnosis not present

## 2018-04-22 DIAGNOSIS — M1990 Unspecified osteoarthritis, unspecified site: Secondary | ICD-10-CM | POA: Diagnosis not present

## 2018-04-22 NOTE — Progress Notes (Signed)
Subjective:    Patient ID: Zachary Cordova, male    DOB: 09-11-1945, 72 y.o.   MRN: [id number]  HPI  72 year old male who  has a past medical history of Arthritis, Chicken pox, Colon polyps, Diverticulitis, Hepatitis C, History of blood transfusion, Hypertension, Measles, Mumps, and Rheumatoid arthritis ([place]).  Patient presents to the office today for follow-up regarding a chronic issue of pain management.  He was previously being seen by pain management walked out of their office on Monday due to feeling as though they were decreasing his pain medication too much.  Leaving for [place] at the end of the month and feels as though the medication that they currently have prescribed does not let him function.  Review of Systems See HPI   Past Medical History:  Diagnosis Date  . Arthritis    Lower spine, left knee and hands  . Chicken pox   . Colon polyps   . Diverticulitis   . Hepatitis C   . History of blood transfusion   . Hypertension   . Measles    Between 7-85 years of age  . Mumps    72 years of age  . Rheumatoid arthritis ([place])     Social History   Socioeconomic History  . Marital status: Single    Spouse name: Not on file  . Number of children: Not on file  . Years of education: Not on file  . Highest education level: Not on file  Occupational History  . Not on file  Social Needs  . Financial resource strain: Not on file  . Food insecurity:    Worry: Not on file    Inability: Not on file  . Transportation needs:    Medical: Not on file    Non-medical: Not on file  Tobacco Use  . Smoking status: Current Every Day Smoker  . Smokeless tobacco: Never Used  Substance and Sexual Activity  . Alcohol use: Yes    Alcohol/week: 5.0 standard drinks    Types: 4 Cans of beer, 1 Shots of liquor per week  . Drug use: No  . Sexual activity: Not on file  Lifestyle  . Physical activity:    Days per week: Not on file    Minutes per session: Not on file  .  Stress: Not on file  Relationships  . Social connections:    Talks on phone: Not on file    Gets together: Not on file    Attends religious service: Not on file    Active member of club or organization: Not on file    Attends meetings of clubs or organizations: Not on file    Relationship status: Not on file  . Intimate partner violence:    Fear of current or ex partner: Not on file    Emotionally abused: Not on file    Physically abused: Not on file    Forced sexual activity: Not on file  Other Topics Concern  . Not on file  Social History Narrative  . Not on file    Past Surgical History:  Procedure Laterality Date  . APPENDECTOMY    . CHOLECYSTECTOMY    . REPLACEMENT TOTAL KNEE     Rt leg  . TONSILLECTOMY AND ADENOIDECTOMY      Family History  Problem Relation Age of Onset  . Emphysema Mother     Allergies  Allergen Reactions  . Codeine     UPSET STOMACH    Current  Outpatient Medications on File Prior to Visit  Medication Sig Dispense Refill  . Ascorbic Acid (VITAMIN C PO) Take by mouth.    . Cyanocobalamin (B-12 PO) Take by mouth.    . furosemide (LASIX) 20 MG tablet Take 1 tablet (20 mg total) by mouth daily. 30 tablet 3  . gabapentin (NEURONTIN) 400 MG capsule Take 1 capsule (400 mg total) by mouth 4 (four) times daily. 360 capsule 2  . ibuprofen (ADVIL,MOTRIN) 600 MG tablet TAKE 1 TABLET EVERY 8 HOURS AS NEEDED 90 tablet 1  . losartan (COZAAR) 25 MG tablet TAKE 1 TABLET BY MOUTH EVERYDAY AT BEDTIME 90 tablet 1  . losartan-hydrochlorothiazide (HYZAAR) 50-12.5 MG tablet Take 1 tablet by mouth daily. 90 tablet 1  . mirtazapine (REMERON) 15 MG tablet TAKE 1 TABLET AT BEDTIME 90 tablet 0  . Multiple Vitamin (MULTIVITAMIN) tablet Take 1 tablet by mouth daily.    Marland Kitchen NARCAN 4 MG/0.1ML LIQD nasal spray kit INHALE 1 SPRAY AS NEEDED FOR ACCIDENTAL OVERDOSE  0  . potassium chloride (K-DUR) 10 MEQ tablet Take 1 tablet (10 mEq total) by mouth daily. Take with furosemide.  30 tablet 1  . Testosterone Cypionate 200 MG/ML SOLN Inject 1 mL as directed every 14 (fourteen) days.    Marland Kitchen triamcinolone ointment (KENALOG) 0.5 % Apply 1 application topically 2 (two) times daily. 30 g 2  . morphine (MS CONTIN) 60 MG 12 hr tablet Take 1 tablet by mouth 2 (two) times daily.  0   No current facility-administered medications on file prior to visit.     BP (!) 142/80 (BP Location: Left Arm, Patient Position: Sitting, Cuff Size: Normal)   Pulse 77   Temp 98.4 F (36.9 C) (Oral)   Wt 191 lb (86.6 kg)   SpO2 93%   BMI 29.91 kg/m       Objective:   Physical Exam  Constitutional: He is oriented to person, place, and time. He appears well-developed and well-nourished. No distress.  Neurological: He is alert and oriented to person, place, and time.  Skin: Skin is warm and dry. Capillary refill takes less than 2 seconds. He is not diaphoretic.  Psychiatric: He has a normal mood and affect. His behavior is normal. Judgment and thought content normal.  Nursing note and vitals reviewed.     Assessment & Plan:  1. Narcotic drug use I apologized to the patient but I am unable to help him with this issue.  I advised him to take the medication that he is currently prescribed.  He can start looking for pain management practice in [place] so that he can get up when he moves  [name], NP

## 2018-04-24 NOTE — Telephone Encounter (Signed)
SENT TO THE PHARMACY BY E-SCRIBE. 

## 2018-04-24 NOTE — Telephone Encounter (Signed)
Called and spoke to the pharmacy as he should have refills on file.  Was notified that his insurance requires a 90 day supply.  Unsure if pt was given a 30 day supply due to it being a short term prescription.  Will check with [facility].  Pt is moving the middle of Nov 2019.

## 2018-04-24 NOTE — Telephone Encounter (Signed)
Ok for 90 days  

## 2018-05-05 ENCOUNTER — Encounter: Payer: Self-pay | Admitting: Family Medicine

## 2018-05-21 DIAGNOSIS — M1990 Unspecified osteoarthritis, unspecified site: Secondary | ICD-10-CM | POA: Diagnosis not present

## 2018-05-21 DIAGNOSIS — Z79899 Other long term (current) drug therapy: Secondary | ICD-10-CM | POA: Diagnosis not present

## 2018-05-21 DIAGNOSIS — G894 Chronic pain syndrome: Secondary | ICD-10-CM | POA: Diagnosis not present

## 2018-06-24 ENCOUNTER — Other Ambulatory Visit: Payer: Self-pay | Admitting: Adult Health

## 2018-06-24 DIAGNOSIS — R600 Localized edema: Secondary | ICD-10-CM

## 2018-06-24 NOTE — Telephone Encounter (Signed)
Denied.  Pt is not taking this medication.

## 2018-07-22 ENCOUNTER — Other Ambulatory Visit: Payer: Self-pay | Admitting: Adult Health

## 2018-07-22 DIAGNOSIS — R600 Localized edema: Secondary | ICD-10-CM

## 2018-07-22 IMAGING — DX DG CHEST 2V
2 series · 2 of 2 positions shown · non-contrast
Comparison: None.

CLINICAL DATA: Lower extremity edema for 1 month, dyspnea for 1
week. History of hypertension, pneumonia. Smoker.

EXAM:
CHEST - 2 VIEW

[chest pa]
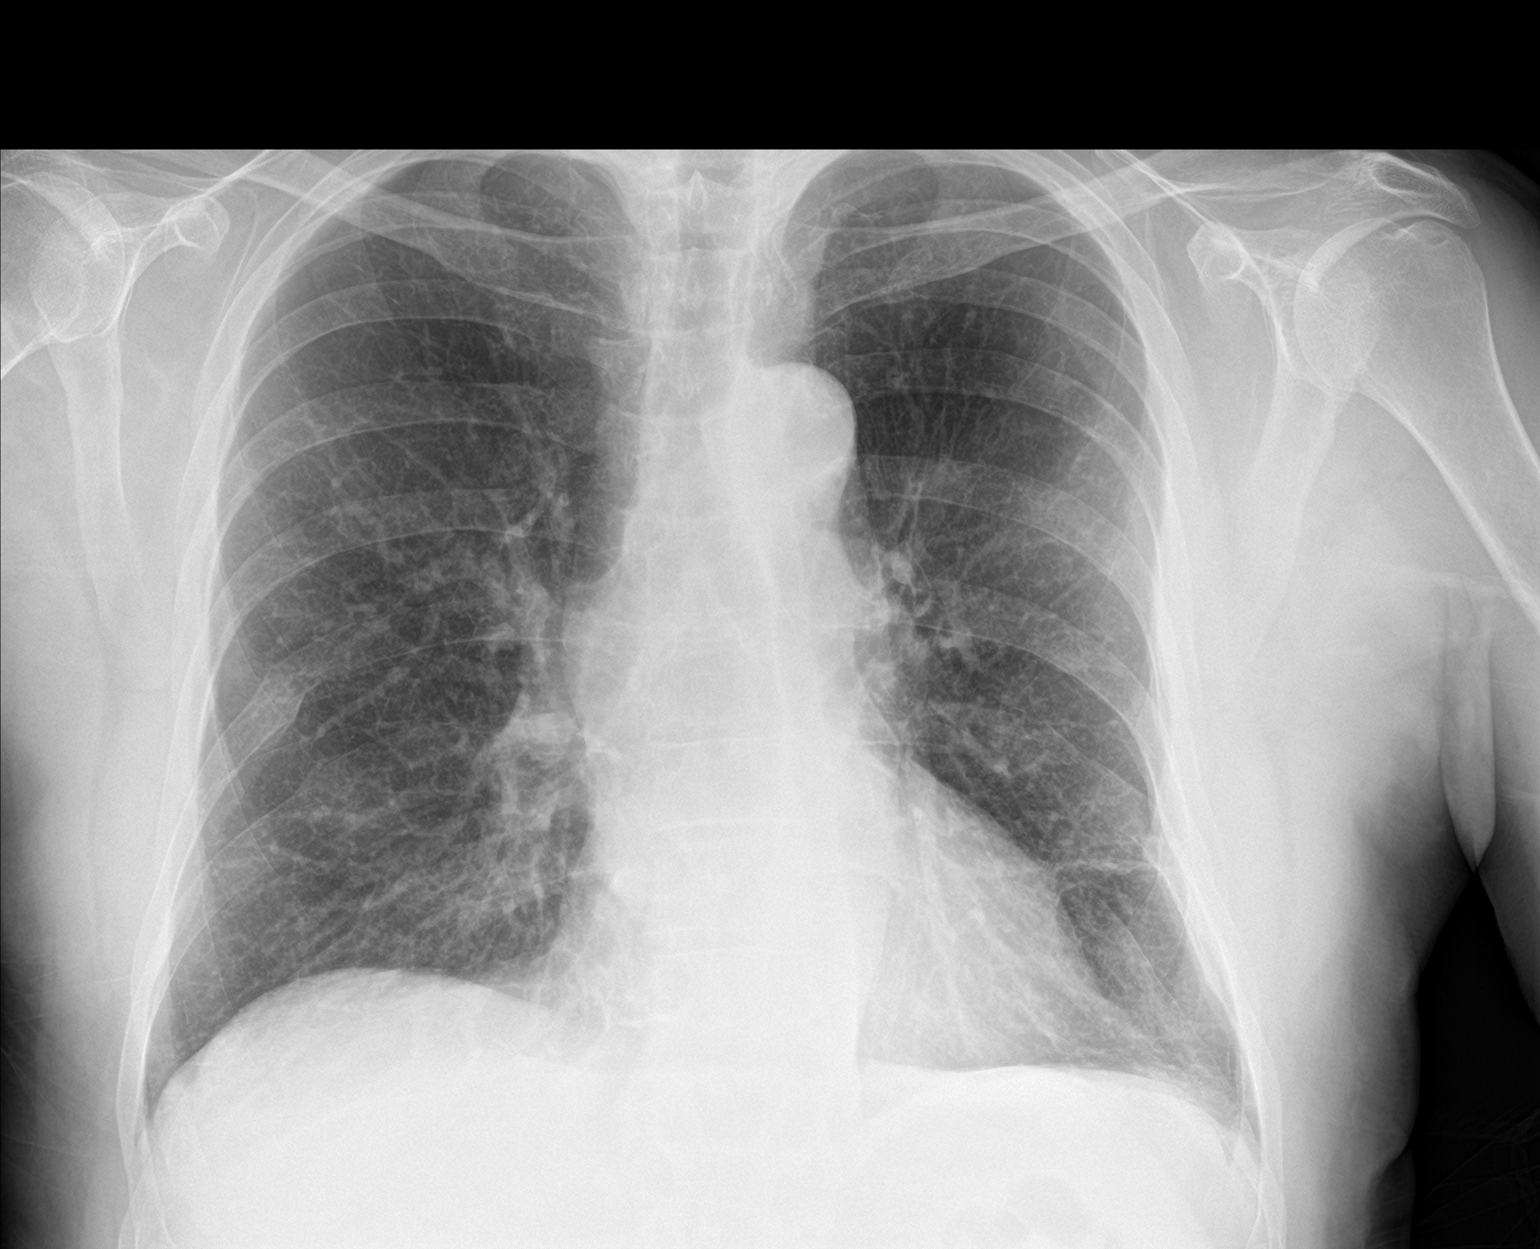

[chest lat]
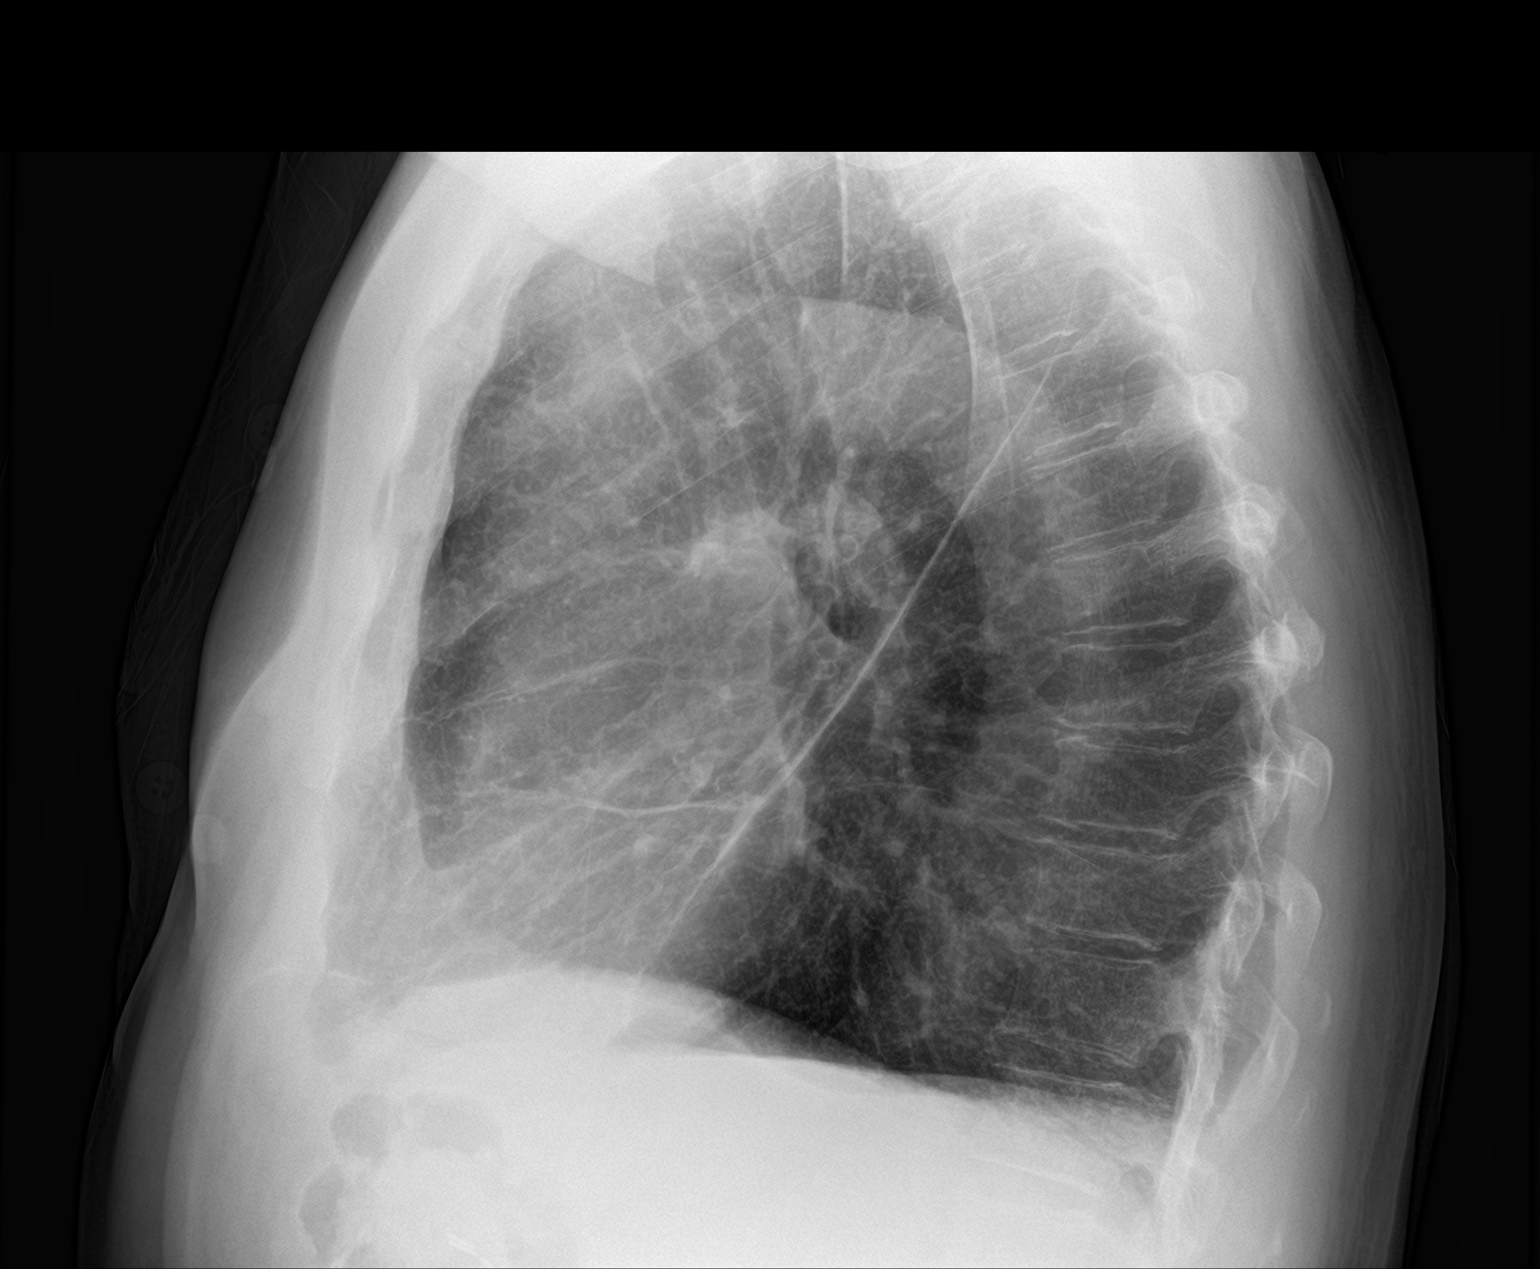

[2 of 2 positions shown; findings below may reference images not displayed]

FINDINGS: Heart size and mediastinal contours are within normal limits. Lungs
are hyperexpanded. There is a small LEFT perihilar nodule versus
chronic bronchitic changes. No pleural effusion or pneumothorax
seen.

Old healed rib fractures noted on the RIGHT. No acute or suspicious
osseous finding
IMPRESSION: 1. Small LEFT perihilar nodule and/or chronic bronchitic change.
Neoplastic nodule not excluded. Recommend chest CT for further
characterization.
2. Hyperexpanded lungs indicating COPD.
3. No acute findings.  No evidence of pneumonia or pulmonary edema.

These results will be called to the ordering clinician or
representative by the Radiologist Assistant, and communication
documented in the PACS or zVision Dashboard.

## 2018-07-23 NOTE — Telephone Encounter (Signed)
Need to see if the pt has moved.  Last message said he was moving in the middle of November.

## 2018-07-24 NOTE — Telephone Encounter (Signed)
Left a message for a return call.

## 2018-07-28 ENCOUNTER — Telehealth: Payer: Self-pay | Admitting: Family Medicine

## 2018-07-28 NOTE — Telephone Encounter (Signed)
Spoke to the pt.  He has now moved to [place].  He does not need refills at this time.  Has an appt with new PCP beginning of next month.  Nothing further needed at this time.

## 2018-08-20 IMAGING — DX DG CHEST 2V
2 series · 2 of 2 positions shown · non-contrast
Comparison: 02/12/2018

CLINICAL DATA: Shortness of breath for 2 months

EXAM:
CHEST - 2 VIEW

[chest pa]
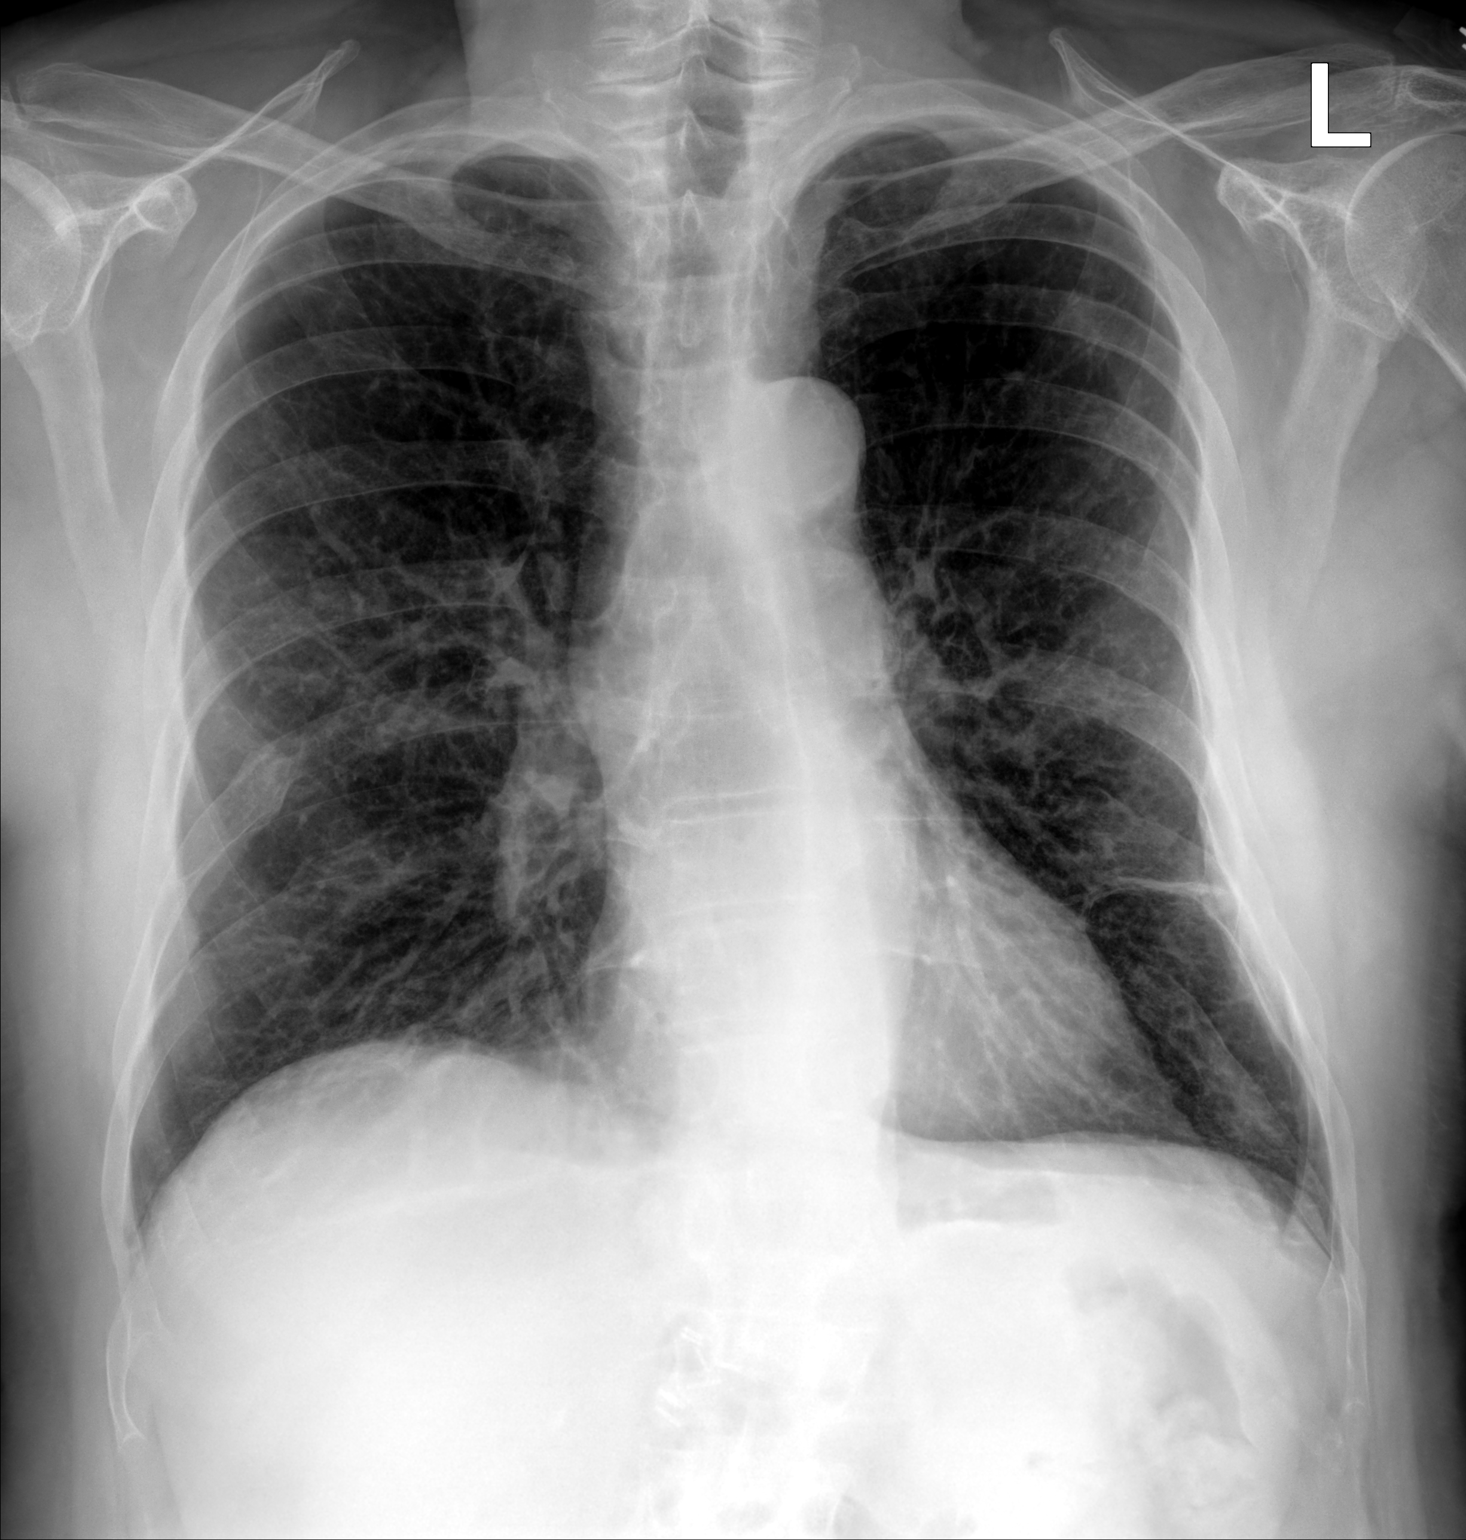

[chest lat]
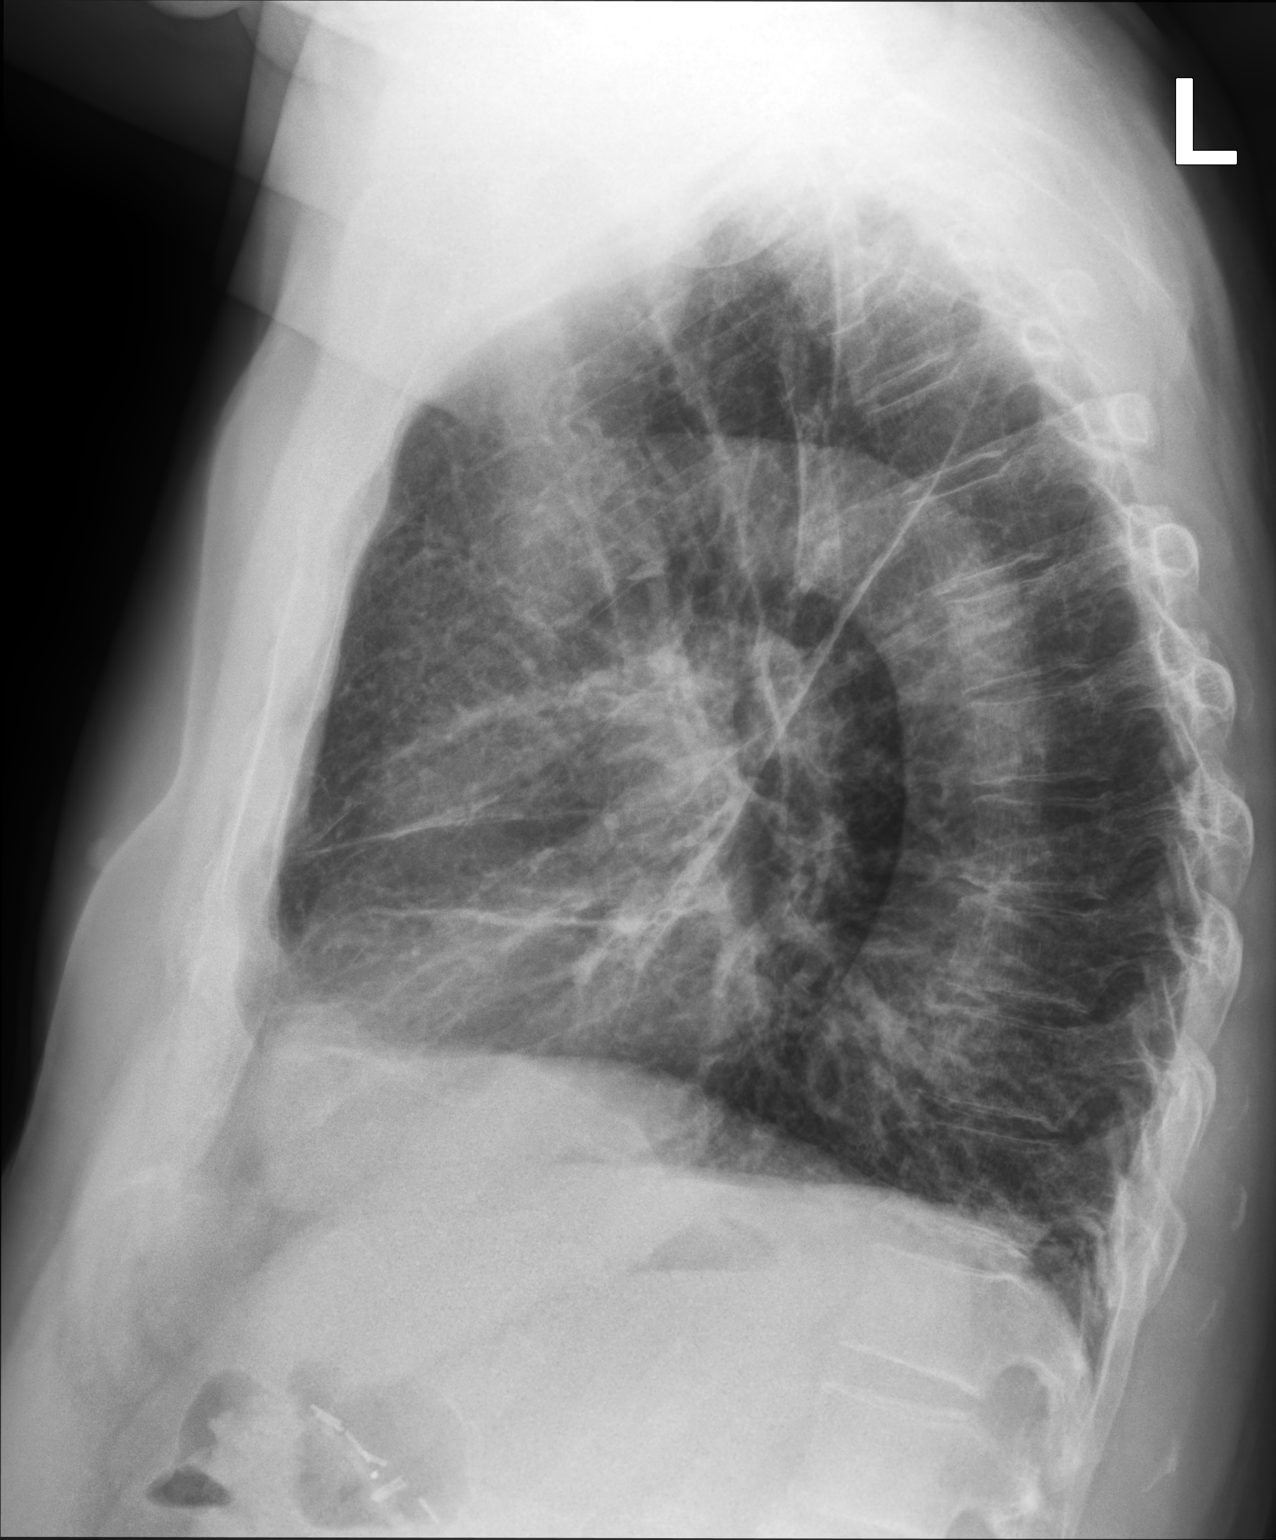

[2 of 2 positions shown; findings below may reference images not displayed]

FINDINGS: Cardiac shadow is within normal limits. Lungs are well aerated
bilaterally. No focal infiltrate is seen. Linear scarring is noted
in the left base. No sizable effusion is noted. No acute bony
abnormality is seen. Old rib fractures on the right are noted.
IMPRESSION: Chronic appearing changes without acute abnormality.
